# Patient Record
Sex: Female | Born: 1955 | Race: White | Hispanic: No | Marital: Single | Smoking: Current every day smoker
Health system: Southern US, Community
[De-identification: ages and names within clinical notes are randomized; demographics above are authoritative.]

## PROBLEM LIST (undated history)

## (undated) DIAGNOSIS — F419 Anxiety disorder, unspecified: Secondary | ICD-10-CM

## (undated) DIAGNOSIS — I10 Essential (primary) hypertension: Secondary | ICD-10-CM

## (undated) DIAGNOSIS — Z8639 Personal history of other endocrine, nutritional and metabolic disease: Secondary | ICD-10-CM

## (undated) DIAGNOSIS — K219 Gastro-esophageal reflux disease without esophagitis: Secondary | ICD-10-CM

## (undated) HISTORY — PX: OTHER SURGICAL HISTORY: SHX169

---

## 1999-02-12 ENCOUNTER — Ambulatory Visit (HOSPITAL_BASED_OUTPATIENT_CLINIC_OR_DEPARTMENT_OTHER): Admission: RE | Admit: 1999-02-12 | Discharge: 1999-02-12 | Payer: Self-pay | Admitting: Oral Surgery

## 2000-12-09 ENCOUNTER — Ambulatory Visit (HOSPITAL_COMMUNITY): Admission: RE | Admit: 2000-12-09 | Discharge: 2000-12-09 | Payer: Self-pay | Admitting: Obstetrics and Gynecology

## 2000-12-09 ENCOUNTER — Encounter (INDEPENDENT_AMBULATORY_CARE_PROVIDER_SITE_OTHER): Payer: Self-pay | Admitting: Specialist

## 2001-11-22 ENCOUNTER — Encounter: Payer: Self-pay | Admitting: Obstetrics and Gynecology

## 2001-11-29 ENCOUNTER — Inpatient Hospital Stay (HOSPITAL_COMMUNITY): Admission: RE | Admit: 2001-11-29 | Discharge: 2001-12-02 | Payer: Self-pay | Admitting: Obstetrics and Gynecology

## 2001-11-29 ENCOUNTER — Encounter (INDEPENDENT_AMBULATORY_CARE_PROVIDER_SITE_OTHER): Payer: Self-pay | Admitting: Specialist

## 2002-06-28 HISTORY — PX: ABDOMINAL HYSTERECTOMY: SHX81

## 2008-01-29 ENCOUNTER — Ambulatory Visit (HOSPITAL_COMMUNITY): Admission: RE | Admit: 2008-01-29 | Discharge: 2008-01-29 | Payer: Self-pay | Admitting: *Deleted

## 2008-01-30 ENCOUNTER — Ambulatory Visit (HOSPITAL_COMMUNITY): Admission: RE | Admit: 2008-01-30 | Discharge: 2008-01-30 | Payer: Self-pay | Admitting: *Deleted

## 2008-01-31 ENCOUNTER — Encounter: Admission: RE | Admit: 2008-01-31 | Discharge: 2008-03-19 | Payer: Self-pay | Admitting: *Deleted

## 2008-05-14 ENCOUNTER — Encounter: Admission: RE | Admit: 2008-05-14 | Discharge: 2008-05-14 | Payer: Self-pay | Admitting: *Deleted

## 2008-05-28 ENCOUNTER — Encounter (INDEPENDENT_AMBULATORY_CARE_PROVIDER_SITE_OTHER): Payer: Self-pay | Admitting: *Deleted

## 2008-05-28 ENCOUNTER — Observation Stay (HOSPITAL_COMMUNITY): Admission: RE | Admit: 2008-05-28 | Discharge: 2008-05-28 | Payer: Self-pay | Admitting: *Deleted

## 2008-12-02 ENCOUNTER — Ambulatory Visit (HOSPITAL_COMMUNITY): Admission: RE | Admit: 2008-12-02 | Discharge: 2008-12-03 | Payer: Self-pay | Admitting: *Deleted

## 2008-12-02 HISTORY — PX: LAPAROSCOPIC GASTRIC BANDING: SHX1100

## 2008-12-16 ENCOUNTER — Encounter: Admission: RE | Admit: 2008-12-16 | Discharge: 2009-03-16 | Payer: Self-pay | Admitting: Surgery

## 2010-04-29 ENCOUNTER — Encounter
Admission: RE | Admit: 2010-04-29 | Discharge: 2010-04-29 | Payer: Self-pay | Source: Home / Self Care | Attending: Surgery | Admitting: Surgery

## 2010-06-28 HISTORY — PX: OTHER SURGICAL HISTORY: SHX169

## 2010-07-15 ENCOUNTER — Encounter
Admission: RE | Admit: 2010-07-15 | Discharge: 2010-07-28 | Payer: Self-pay | Source: Home / Self Care | Attending: Surgery | Admitting: Surgery

## 2010-07-20 ENCOUNTER — Encounter: Payer: Self-pay | Admitting: *Deleted

## 2010-10-05 LAB — DIFFERENTIAL
Basophils Absolute: 0.1 10*3/uL (ref 0.0–0.1)
Basophils Relative: 0 % (ref 0–1)
Basophils Relative: 1 % (ref 0–1)
Eosinophils Absolute: 0.2 10*3/uL (ref 0.0–0.7)
Eosinophils Absolute: 0.3 10*3/uL (ref 0.0–0.7)
Eosinophils Relative: 2 % (ref 0–5)
Eosinophils Relative: 3 % (ref 0–5)
Lymphs Abs: 2.9 10*3/uL (ref 0.7–4.0)
Monocytes Absolute: 0.4 10*3/uL (ref 0.1–1.0)
Monocytes Absolute: 0.6 10*3/uL (ref 0.1–1.0)
Monocytes Relative: 5 % (ref 3–12)
Monocytes Relative: 5 % (ref 3–12)
Neutrophils Relative %: 61 % (ref 43–77)

## 2010-10-05 LAB — COMPREHENSIVE METABOLIC PANEL
ALT: 57 U/L — ABNORMAL HIGH (ref 0–35)
Albumin: 4 g/dL (ref 3.5–5.2)
Alkaline Phosphatase: 139 U/L — ABNORMAL HIGH (ref 39–117)
BUN: 15 mg/dL (ref 6–23)
Chloride: 104 mEq/L (ref 96–112)
Potassium: 4.6 mEq/L (ref 3.5–5.1)
Sodium: 141 mEq/L (ref 135–145)
Total Bilirubin: 0.7 mg/dL (ref 0.3–1.2)
Total Protein: 7.3 g/dL (ref 6.0–8.3)

## 2010-10-05 LAB — CBC
HCT: 39.7 % (ref 36.0–46.0)
HCT: 43.6 % (ref 36.0–46.0)
Hemoglobin: 13.7 g/dL (ref 12.0–15.0)
Hemoglobin: 14.4 g/dL (ref 12.0–15.0)
MCHC: 34.5 g/dL (ref 30.0–36.0)
MCV: 88.4 fL (ref 78.0–100.0)
Platelets: 193 10*3/uL (ref 150–400)
RBC: 4.5 MIL/uL (ref 3.87–5.11)
RDW: 15.8 % — ABNORMAL HIGH (ref 11.5–15.5)
WBC: 11.5 10*3/uL — ABNORMAL HIGH (ref 4.0–10.5)
WBC: 9 10*3/uL (ref 4.0–10.5)

## 2010-11-10 NOTE — Op Note (Signed)
Candice Holland, Candice Holland               ACCOUNT NO.:  [id number]   MEDICAL RECORD NO.:  [id number]          PATIENT TYPE:  INP   LOCATION:  0001                         FACILITY:  [facility]   PHYSICIAN:  Sandria Bales. Ezzard Standing, M.D.  DATE OF BIRTH:  1955/08/23   DATE OF PROCEDURE:  05/28/2008  DATE OF DISCHARGE:                               OPERATIVE REPORT   Date of surgery ??   PREOPERATIVE DIAGNOSIS:  Morbid obesity with a BMI of 48.9.   POSTOPERATIVE DIAGNOSES:  1. Morbid obesity with a BMI of 48.9.  2. Cirrhotic changes of the liver.   PROCEDURE:  Esophagogastroduodenoscopy.   SURGEON:  Dr. Ezzard Standing.   ASSESSMENT:  No first assistant.   ANESTHESIA:  General.   PROCEDURE:  Ms. Elderkin  is a 55 year old female who is undergoing an  attempted laparoscopic Roux-en-Y gastric bypass by Dr. Baruch Merl for  her morbid obesity.  Unfortunately, the patient was found to have  significant cirrhotic changes of the liver at laparoscopy.   There is a concern of the patient having potential portal hypertension  and, therefore, I am endoscoping the patient to evaluate for any  evidence of esophageal or gastric varices consistent with portal  hypertension, to evaluate the anatomy of  the duodenum, stomach or the  esophagus.   PROCEDURE NOTE:  The patient is in the supine position.  The patient  still has a laparoscope in the abdomen, passed the Olympus endoscope  down her throat into her stomach and duodenum without difficulty.  I  advanced to the second portion of duodenum and visualized second portion  of duodenum, first portion of duodenum, there is no ulcer or lesion.  The pylorus was widely patent.   The gastric mucosa is unremarkable with normal rugal folds.  No evidence  of inflammation, irritation.  I retroflexed the scope and looked at the  gastroesophageal junction from underneath and there was normal  gastroesophageal junction with no evidence of variceal changes.  The  scope was then  withdrawn into the GE junction which was a 36 cm from the  incisors (of lack of incisors) and there is no esophageal varices.  The  esophagus was otherwise normal looking.   The scope was then withdrawn.  I did no biopsies.  The patient tolerated  this portion of procedure well.   Dr. Colin Benton will dictate the laparoscopic exploration and liver biopsy.      Sandria Bales. Ezzard Standing, M.D.  Electronically Signed     DHN/MEDQ  D:  05/28/2008  T:  05/28/2008  Job:  [id number]   cc:   Alfonse Ras, MD  [address]  [place]  [place] [address]   Sandria Bales. Ezzard Standing, M.D.  [address], [address]  [place]  [place] [address]

## 2010-11-10 NOTE — Op Note (Signed)
NAME:  Candice Holland, Candice Holland               ACCOUNT NO.:  [id number]   MEDICAL RECORD NO.:  [id number]          PATIENT TYPE:  INP   LOCATION:  0001                         FACILITY:  [facility]   PHYSICIAN:  Alfonse Ras, MD   DATE OF BIRTH:  Nov 15, 1955   DATE OF PROCEDURE:  DATE OF DISCHARGE:                               OPERATIVE REPORT   PREOPERATIVE DIAGNOSES:  Medically refractory morbid obesity, elevated  liver function tests, BMI of 48.49, and hypertension.   POSTOPERATIVE DIAGNOSIS:  Medically refractory morbid obesity, elevated  liver function tests, BMI of 48.49, hypertension., massive hepatomegaly,  and cirrhosis.   PROCEDURE:  Diagnostic laparoscopy, liver biopsy, lysis of adhesions,  and upper endoscopy by Dr. Sandria Bales. Newman.   SURGEON:  Alfonse Ras, MD   ASSISTANT:  Sandria Bales. Ezzard Standing, M.D.   ANESTHESIA:  General.   DESCRIPTION:  The patient was taken to the operating room, after  informed consent was granted for laparoscopic roux-en-y gastric bypass.  Informed consent was placed on the chart.   Further, the patient underwent general anesthesia, and the abdomen was  prepped and draped in normal sterile fashion using a 12-mm trocar in the  left upper quadrant in the OptiView technique I entered the abdomen.  This was done on direct visualization.  Pneumoperitoneum was obtained.  On entering the abdomen, it was obvious that the liver was massive and  extended down almost to the right iliac crest.  It was very nodular and  consistent with cirrhosis.  Adhesions from the previous hysterectomy  were taken down using the harmonic scalpel.  However, the patient had a  lot of bleeding just from these filmy adhesions.  Because of the massive  size of the liver, what looked like clinically to be a coagulopathy, I  opted to perform a liver biopsy and abort the attempts with Roux-en-Y  gastric bypass.  Tru-Cut liver biopsy was obtained.  Pressure was held  for 10 minutes until  adequate hemostasis was ensured.  Biopsy was sent  for pathologic evaluation.  The abdomen was irrigated.  The trocars were  removed.  Pneumoperitoneum was released.  Dr. Ezzard Standing performed upper  endoscopy which will be dictated in a separate note.  Incisions were  closed with subcuticular 4-0 Monocryl.  Steri-Strips and sterile  dressings were applied.  The patient tolerated the procedure well, and  went to PACU in stable condition.   The patient will be kept for observation this afternoon.  I will check  an H&H as well as the hepatitis profile and coags.  I discussed the case  with the patient's sister and parents, and explained that I think she  would be a good candidate for laparoscopic adjustable gastric banding,  but at this time aborted laparoscopic gastric bypass in the  lieu of  further workup of her liver disease.      Alfonse Ras, MD  Electronically Signed     KRE/MEDQ  D:  05/28/2008  T:  05/28/2008  Job:  [id number]

## 2010-11-10 NOTE — Op Note (Signed)
NAMEESBEIDY, MCLAINE               ACCOUNT NO.:  [id number]   MEDICAL RECORD NO.:  [id number]          PATIENT TYPE:  AMB   LOCATION:  DAY                          FACILITY:  [facility]   PHYSICIAN:  Thornton Park. Daphine Deutscher, MD  DATE OF BIRTH:  1956-01-28   DATE OF PROCEDURE:  DATE OF DISCHARGE:                               OPERATIVE REPORT   PREOPERATIVE INDICATIONS:  Diabetes, NASH with active cirrhosis noted on  attempted Roux-en-Y gastric bypass December 2009.   PROCEDURE:  Laparoscopic adjustable gastric banding (Allergan APL  system).   SURGEON:  Luretha Murphy.   ASSISTANT:  Ovidio Kin.   ANESTHESIA:  General endotracheal.   DESCRIPTION OF PROCEDURE:  Candice Holland is a 55 year old white female  taken to room 1 on Monday, December 02, 2008 and given general anesthesia.  The abdomen was prepped with __________ equivalent and draped sterilely.  I used her previous incision in the left upper quadrant and entered the  abdomen with an OptiVu technique without difficulty.  The abdomen was  insufflated.  Standard port placement was used.  She did have some  adhesions in the lower abdomen which I did not disturb.  The liver was  then retractor was placed straight away to make sure we would be able to  see this before the trocars were placed.  Once these were in place I  dissected the left side first and then the right side.  A sizing balloon  was then placed, blown up with 15 mL of air, pulled back and did not  indicate any evidence of a hiatal hernia.  We then went and placed the  APL band securing it over the sizing tubing and then withdrew the  tubing.   The anterior plication was then carried out with three interrupted  sutures of Surgidac and held in place with tie knots.  The band port was  then placed in the subcutaneous pocket on the right side and after a  piece of mesh was sewn to the back of the port.  The wounds were  irrigated and closed with 4-0 Vicryl, Benzoin, and  Steri-Strips.  The  patient seemed to tolerate the procedure well and was taken to recovery  room in satisfactory condition.      Thornton Park Daphine Deutscher, MD  Electronically Signed     MBM/MEDQ  D:  12/02/2008  T:  12/02/2008  Job:  [id number]   cc:   Rema Fendt, MD   Herma Mering (?), MD

## 2010-11-13 NOTE — Discharge Summary (Signed)
[facility]  Patient:    Candice Holland, Candice Holland Visit Number: [id number] MRN: [id number]          Service Type: GYN Location: 4W 0446 01 Attending Physician:  Lendon Colonel Dictated by:   Kathie Rhodes. Kyra Manges, M.D. Admit Date:  11/29/2001 Discharge Date: 12/02/2001   CC:         Verl Dicker, M.D.   Discharge Summary  ADMITTING DIAGNOSIS:  Persistent menorrhagia.  POSTOPERATIVE DIAGNOSIS:  Adenomyosis, uterine leiomyoma.  HISTORY OF PRESENT ILLNESS:  The patient is a 55 year old female status post ectopic pregnancy who was admitted for hysterectomy for persistent menorrhagia.  LABORATORY STUDIES:  Admission hemoglobin was 13, white count was 13,000. Chemistry was essentially normal other than a glucose of 145.  Cardiogram and chest x-rays were normal.  HOSPITAL COURSE:  Patient underwent a hysterectomy and bilateral salpingo-oophorectomy with the findings of dense pelvic adhesions.  At the time of surgery, a cystotomy was performed.  It was recognized ______ and repaired.  However, it was still not watertight, so intraoperative urologic consultation with Dr. Wanda Plump was obtained and a complex repair of the bladder was performed by Dr. Wanda Plump.  Her postoperative course was uncomplicated.  She remained afebrile and without complaints.  Her suprapubic drain was removed on day #2.  She is discharged with an indwelling Foley to return to the office in one week.  She will see Dr. Wanda Plump in approximately two weeks.  CONDITION ON DISCHARGE:  Improved. Dictated by:   S. Kyra Manges, M.D. Attending Physician:  Lendon Colonel DD:  12/09/01 TD:  12/11/01 Job: [id number]

## 2010-11-13 NOTE — H&P (Signed)
[facility]  Patient:    Candice Holland, MORNING Visit Number: [id number] MRN: [id number]          Service Type: GYN Location: 4W 0446 01 Attending Physician:  Lendon Colonel Dictated by:   Kathie Rhodes. Kyra Manges, M.D. Admit Date:  11/29/2001                           History and Physical  CHIEF COMPLAINT:  Continued heavy bleeding.  HISTORY OF PRESENT ILLNESS:  The patient is a 55 year old gravida 2, para 0 female who presents for surgery for continued heavy bleeding despite conservative efforts including hysteroscopy.  She is currently on two Lo/Ovral daily to maintain her heavy periods.  Her comorbidities include hypertension controlled with Zestril and hydrochlorothiazide and obesity.  She has a history of exploratory laparotomy for ectopic.  She has a history of D&C for therapeutic terminations.  She had carpal tunnel surgery twice, and hysteroscopy.  Her last period was Oct 28, 2001.  Her hemoglobin has remained stable.  Because of the continued bleeding, a hysterectomy was recommended. 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  Pathologically, these were all benign.  REVIEW OF SYSTEMS:  HEENT:  She wears contacts but has noted no decrease in visual or auditory acuity.  No dizziness.  HEART:  She has been treated for hypertension, fairly stable at this point with an ACE inhibitor and diuretic. She denies chest pain, shortness of breath, history of mitral valve prolapse, or heart murmur.  LUNGS:  No chronic cough.  GENITOURINARY:  No stress incontinence.  Frequency.  No history of blood in her urine. GASTROINTESTINAL:  No bowel habit change.  She admits to irritable bowel syndrome.  She denies melena, weight loss, or gain.  MUSCLES, BONES, JOINTS: Other than bilateral carpal tunnel, unremarkable.  SOCIAL HISTORY:  She works for [employer].  Smokes a pack of cigarettes a day, and she confesses she is  going to try to stop in the postoperative period.  FAMILY HISTORY:  Her mother and father are both living and well.  She has one brother who is living and well.  No cancer in the family.  She is ______ hypertension.  PHYSICAL EXAMINATION:  GENERAL:  Well-developed, nourished female who appears to be her stated age.  VITAL SIGNS:  Weight 242 pounds, height 5 feet 3 inches.  Blood pressure 140/80.  HEENT:  Pupils are round and regular and reactive to light and accommodation. Oropharynx is not injected.  NECK:  Supple.  Carotid pulses are felt bilaterally without bruits.  Thyroid is not enlarged.  LUNGS:  Clear to P&A.  BREASTS:  No masses or tenderness.  HEART:  Normal sinus rhythm.  No heaves, thrills, rubs, or gallops.  ABDOMEN:  Soft, somewhat obese.  Most of her obesity is truncal in nature. Liver, spleen, and kidneys are not palpated.  EXTREMITIES:  Good range of motion.  Equal pulses and reflexes.  PELVIC:  Anterior uterus, appears to be normal in size and shape, well supported, without masses.  RECTOVAGINAL:  Confirms.  IMPRESSION:  Continued heavy periods despite conservative efforts to control these periods.  PLAN:  Total abdominal hysterectomy and bilateral salpingo-oophorectomy.  The risks and benefits have been discussed with the patient including bowel damage, bladder infection, and blood transfusion, and the risks ______ to these. Dictated by:   S. Kyra Manges, M.D. Attending Physician:  Lendon Colonel DD:  11/29/01 TD:  11/30/01 Job: [id number]

## 2011-02-12 ENCOUNTER — Ambulatory Visit (INDEPENDENT_AMBULATORY_CARE_PROVIDER_SITE_OTHER): Payer: 59 | Admitting: Physician Assistant

## 2011-02-12 ENCOUNTER — Encounter (INDEPENDENT_AMBULATORY_CARE_PROVIDER_SITE_OTHER): Payer: Self-pay

## 2011-02-12 VITALS — BP 142/82 | Ht 61.0 in | Wt 174.0 lb

## 2011-02-12 DIAGNOSIS — Z4651 Encounter for fitting and adjustment of gastric lap band: Secondary | ICD-10-CM

## 2011-02-12 NOTE — Patient Instructions (Signed)
Take clear liquids for the next 48 hours. Thin protein shakes are ok to start on Saturday evening. Call us if you have persistent vomiting or regurgitation, night cough or reflux symptoms. Return as scheduled or sooner if you notice no changes in hunger/portion sizes.   

## 2011-02-12 NOTE — Progress Notes (Signed)
  HISTORY: Candice Holland is a 55 y.o.female who received an AP-Large lap-band in June 2010 by Dr. Daphine Deutscher. She's doing very well, having lost 15 lbs since her last visit in April. Her increased protein intake has helped her with this weight loss. She has noticed an increase in her hunger and portion sizes over the past few weeks, however, and believes a fill is needed. She denies persistent regurgitation or vomiting symptoms.  VITAL SIGNS: Filed Vitals:   02/12/11 1009  BP: 142/82    PHYSICAL EXAM: Physical exam reveals a very well-appearing 55 y.o.female in no apparent distress Neurologic: Awake, alert, oriented Psych: Bright affect, conversant Respiratory: Breathing even and unlabored. No stridor or wheezing Abdomen: Soft, nontender, nondistended to palpation. Incisions well-healed. No incisional hernias. Port easily palpated. Extremities: Atraumatic, good range of motion.  ASSESMENT: 55 y.o.  female  s/p AP-Large lap-band.   PLAN: The patient's port was accessed with a 20G Huber needle without difficulty. Clear fluid was aspirated and 0.25 mL saline was added to the port. The patient was able to swallow water without difficulty following the procedure and was instructed to take clear liquids for the next 24-48 hours and advance slowly as tolerated.

## 2011-03-30 LAB — COMPREHENSIVE METABOLIC PANEL
AST: 92 U/L — ABNORMAL HIGH (ref 0–37)
CO2: 28 mEq/L (ref 19–32)
Calcium: 9.9 mg/dL (ref 8.4–10.5)
Creatinine, Ser: 1.03 mg/dL (ref 0.4–1.2)
GFR calc Af Amer: >60 mL/min (ref >60)
GFR calc non Af Amer: 56 mL/min — ABNORMAL LOW (ref >60)
Glucose, Bld: 104 mg/dL — ABNORMAL HIGH (ref 70–99)
Total Protein: 7.1 g/dL (ref 6.0–8.3)

## 2011-03-30 LAB — DIFFERENTIAL
Basophils Absolute: 0.6 10*3/uL — ABNORMAL HIGH (ref 0.0–0.1)
Eosinophils Relative: 3 % (ref 0–5)
Lymphocytes Relative: 35 % (ref 12–46)
Monocytes Relative: 7 % (ref 3–12)
Smear Review: ADEQUATE

## 2011-03-30 LAB — CBC
MCHC: 32.5 g/dL (ref 30.0–36.0)
MCV: 87.7 fL (ref 78.0–100.0)
RBC: 4.77 MIL/uL (ref 3.87–5.11)
RDW: 14 % (ref 11.5–15.5)

## 2011-04-01 LAB — PROTIME-INR
INR: 1.2 (ref 0.00–1.49)
Prothrombin Time: 15.9 seconds — ABNORMAL HIGH (ref 11.6–15.2)

## 2011-04-01 LAB — GLUCOSE, CAPILLARY: Glucose-Capillary: 113 mg/dL — ABNORMAL HIGH (ref 70–99)

## 2011-04-01 LAB — CBC
RBC: 4.54 MIL/uL (ref 3.87–5.11)
WBC: 7.2 10*3/uL (ref 4.0–10.5)

## 2011-04-01 LAB — APTT: aPTT: 31 seconds (ref 24–37)

## 2011-09-02 ENCOUNTER — Encounter (INDEPENDENT_AMBULATORY_CARE_PROVIDER_SITE_OTHER): Payer: 59

## 2011-10-07 ENCOUNTER — Ambulatory Visit (INDEPENDENT_AMBULATORY_CARE_PROVIDER_SITE_OTHER): Payer: 59 | Admitting: Physician Assistant

## 2011-10-07 ENCOUNTER — Encounter (INDEPENDENT_AMBULATORY_CARE_PROVIDER_SITE_OTHER): Payer: Self-pay

## 2011-10-07 VITALS — BP 128/78 | HR 70 | Temp 97.4°F | Resp 18 | Ht 61.0 in | Wt 154.4 lb

## 2011-10-07 DIAGNOSIS — Z4651 Encounter for fitting and adjustment of gastric lap band: Secondary | ICD-10-CM

## 2011-10-07 NOTE — Patient Instructions (Signed)
Take clear liquids tonight. Thin protein shakes are ok to start tomorrow morning. Slowly advance your diet thereafter. Call us if you have persistent vomiting or regurgitation, night cough or reflux symptoms. Return as scheduled or sooner if you notice no changes in hunger/portion sizes.  

## 2011-10-07 NOTE — Progress Notes (Signed)
  HISTORY: Candice Holland is a 56 y.o.female who received an AP-Large lap-band in June 2010 by Dr. Daphine Deutscher. She was last seen in August 2012 and has lost almost 20 lbs. She is now under BMI 30. She denies persistent regurgitation or vomiting. She'd like to lose 10 more pounds. She's eating a bit more than she has in the past.  VITAL SIGNS: Filed Vitals:   10/07/11 1213  BP: 128/78  Pulse: 70  Temp: 97.4 F (36.3 C)  Resp: 18    PHYSICAL EXAM: Physical exam reveals a very well-appearing 56 y.o.female in no apparent distress Neurologic: Awake, alert, oriented Psych: Bright affect, conversant Respiratory: Breathing even and unlabored. No stridor or wheezing Abdomen: Soft, nontender, nondistended to palpation. Incisions well-healed. No incisional hernias. Port easily palpated. Extremities: Atraumatic, good range of motion.  ASSESMENT: 56 y.o.  female  s/p AP-large lap-band.   PLAN: The patient's port was accessed with a 20G Huber needle without difficulty. Clear fluid was aspirated and 0.25 mL saline was added to the port. The patient was able to swallow water without difficulty following the procedure and was instructed to take clear liquids for the next 24-48 hours and advance slowly as tolerated.

## 2012-07-27 ENCOUNTER — Ambulatory Visit (INDEPENDENT_AMBULATORY_CARE_PROVIDER_SITE_OTHER): Payer: BC Managed Care – PPO | Admitting: Physician Assistant

## 2012-07-27 ENCOUNTER — Encounter (INDEPENDENT_AMBULATORY_CARE_PROVIDER_SITE_OTHER): Payer: Self-pay

## 2012-07-27 VITALS — BP 140/84 | HR 71 | Temp 97.7°F | Ht 61.0 in | Wt 151.6 lb

## 2012-07-27 DIAGNOSIS — Z4651 Encounter for fitting and adjustment of gastric lap band: Secondary | ICD-10-CM

## 2012-07-27 NOTE — Patient Instructions (Signed)
Take clear liquids tonight. Thin protein shakes are ok to start tomorrow morning. Slowly advance your diet thereafter. Call us if you have persistent vomiting or regurgitation, night cough or reflux symptoms. Return as scheduled or sooner if you notice no changes in hunger/portion sizes.  

## 2012-07-27 NOTE — Progress Notes (Signed)
  HISTORY: Candice Holland is a 57 y.o.female who received an AP-Large lap-band in June 2010 by Dr. Daphine Deutscher. She comes in with a few months of occasional solid food dysphagia which has progressed over the past month. She is unable to take adequate solids and is reaching for chips and slider foods.  VITAL SIGNS: Filed Vitals:   07/27/12 1218  BP: 140/84  Pulse: 71  Temp: 97.7 F (36.5 C)    PHYSICAL EXAM: Physical exam reveals a very well-appearing 57 y.o.female in no apparent distress Neurologic: Awake, alert, oriented Psych: Bright affect, conversant Respiratory: Breathing even and unlabored. No stridor or wheezing Abdomen: Soft, nontender, nondistended to palpation. Incisions well-healed. No incisional hernias. Port easily palpated. Extremities: Atraumatic, good range of motion.  ASSESMENT: 57 y.o.  female  s/p AP-Large lap-band.   PLAN: The patient's port was accessed with a 20G Huber needle without difficulty. Clear fluid was aspirated and 1 mL saline was removed from the port. The patient was able to swallow water without difficulty following the procedure and was instructed to take clear liquids for the next 24-48 hours and advance slowly as tolerated. I asked her to return in three weeks for re-evaluation.

## 2012-08-04 ENCOUNTER — Telehealth (INDEPENDENT_AMBULATORY_CARE_PROVIDER_SITE_OTHER): Payer: Self-pay | Admitting: General Surgery

## 2012-08-04 ENCOUNTER — Other Ambulatory Visit (INDEPENDENT_AMBULATORY_CARE_PROVIDER_SITE_OTHER): Payer: Self-pay

## 2012-08-04 ENCOUNTER — Telehealth (INDEPENDENT_AMBULATORY_CARE_PROVIDER_SITE_OTHER): Payer: Self-pay

## 2012-08-04 DIAGNOSIS — R110 Nausea: Secondary | ICD-10-CM

## 2012-08-04 NOTE — Telephone Encounter (Signed)
Pt called to report that she had dry heaves this am with pink tinged mucus after eating breakfast, also experiencing some nausea. I reviewed this with Dr. Daphine Deutscher and he requested an Upper GI Series to be done today preferably or Monday am. Pt also to stay on clear liquids. /gy/ I contacted pt with instructions/gy/ [phone]

## 2012-08-04 NOTE — Telephone Encounter (Signed)
I called pt and advised her of Dr. Ermalene Searing instructions reading ugi series and clear liquid diet./gy

## 2012-08-04 NOTE — Telephone Encounter (Signed)
Returned pt's call with her appt date and time for her UGI that MM had requested I order for her.    Monday, Feb 10th @ 830a at [facility] [address]

## 2012-08-07 ENCOUNTER — Ambulatory Visit
Admission: RE | Admit: 2012-08-07 | Discharge: 2012-08-07 | Disposition: A | Discharge status: Home / Self Care | Payer: BC Managed Care – PPO | Source: Ambulatory Visit | Attending: Surgery | Admitting: Surgery

## 2012-08-07 DIAGNOSIS — R110 Nausea: Secondary | ICD-10-CM

## 2012-08-24 ENCOUNTER — Encounter (INDEPENDENT_AMBULATORY_CARE_PROVIDER_SITE_OTHER): Payer: Self-pay

## 2012-08-24 ENCOUNTER — Ambulatory Visit (INDEPENDENT_AMBULATORY_CARE_PROVIDER_SITE_OTHER): Payer: BC Managed Care – PPO | Admitting: Physician Assistant

## 2012-08-24 VITALS — BP 122/88 | HR 72 | Temp 98.0°F | Resp 18 | Ht 61.0 in | Wt 153.0 lb

## 2012-08-24 DIAGNOSIS — Z4651 Encounter for fitting and adjustment of gastric lap band: Secondary | ICD-10-CM

## 2012-08-24 NOTE — Patient Instructions (Signed)
Take clear liquids tonight. Thin protein shakes are ok to start tomorrow morning. Slowly advance your diet thereafter. Call us if you have persistent vomiting or regurgitation, night cough or reflux symptoms. Return as scheduled or sooner if you notice no changes in hunger/portion sizes.  

## 2012-08-24 NOTE — Progress Notes (Signed)
  HISTORY: Candice Holland is a 57 y.o.female who received an AP-Large lap-band in June 2010 by Dr. Daphine Deutscher. She comes in with increasing hunger and portion sizes since fluid removal one month ago for obstructive symptoms. She has no further obstruction and would like to get back on track with weight loss.  VITAL SIGNS: Filed Vitals:   08/24/12 1136  BP: 122/88  Pulse: 72  Temp: 98 F (36.7 C)  Resp: 18    PHYSICAL EXAM: Physical exam reveals a very well-appearing 57 y.o.female in no apparent distress Neurologic: Awake, alert, oriented Psych: Bright affect, conversant Respiratory: Breathing even and unlabored. No stridor or wheezing Abdomen: Soft, nontender, nondistended to palpation. Incisions well-healed. No incisional hernias. Port easily palpated. Extremities: Atraumatic, good range of motion.  ASSESMENT: 57 y.o.  female  s/p AP-Large lap-band.   PLAN: The patient's port was accessed with a 20G Huber needle without difficulty. Clear fluid was aspirated and 0.5 mL saline was added to the port. The patient was able to swallow water without difficulty following the procedure and was instructed to take clear liquids for the next 24-48 hours and advance slowly as tolerated.

## 2012-11-23 ENCOUNTER — Ambulatory Visit (INDEPENDENT_AMBULATORY_CARE_PROVIDER_SITE_OTHER): Payer: BC Managed Care – PPO | Admitting: Physician Assistant

## 2012-11-23 ENCOUNTER — Encounter (INDEPENDENT_AMBULATORY_CARE_PROVIDER_SITE_OTHER): Payer: BC Managed Care – PPO

## 2012-11-23 ENCOUNTER — Encounter (INDEPENDENT_AMBULATORY_CARE_PROVIDER_SITE_OTHER): Payer: Self-pay

## 2012-11-23 VITALS — BP 128/82 | HR 64 | Temp 98.2°F | Resp 18 | Ht 61.0 in | Wt 156.6 lb

## 2012-11-23 DIAGNOSIS — Z4651 Encounter for fitting and adjustment of gastric lap band: Secondary | ICD-10-CM

## 2012-11-23 NOTE — Patient Instructions (Signed)
Take clear liquids tonight. Thin protein shakes are ok to start tomorrow morning. Slowly advance your diet thereafter. Call us if you have persistent vomiting or regurgitation, night cough or reflux symptoms. Return as scheduled or sooner if you notice no changes in hunger/portion sizes.  

## 2012-11-23 NOTE — Progress Notes (Signed)
  HISTORY: Candice Holland is a 57 y.o.female who received an AP-Standard lap-band in June 2010 by Dr. Daphine Deutscher. She comes in with three lbs weight gain since her last adjustment in February. We removed 1 mL fluid in January for obstructive symptoms. Half of that was replaced. She reports increased hunger and larger than desired portion sizes. She reports that her obstruction was likely triggered by stress late in the year as previously she was doing fine. She has no complaints of reflux or regurgitation presently.  VITAL SIGNS: Filed Vitals:   11/23/12 1238  BP: 128/82  Pulse: 64  Temp: 98.2 F (36.8 C)  Resp: 18    PHYSICAL EXAM: Physical exam reveals a very well-appearing 57 y.o.female in no apparent distress Neurologic: Awake, alert, oriented Psych: Bright affect, conversant Respiratory: Breathing even and unlabored. No stridor or wheezing Abdomen: Soft, nontender, nondistended to palpation. Incisions well-healed. No incisional hernias. Port easily palpated. Extremities: Atraumatic, good range of motion.  ASSESMENT: 57 y.o.  female  s/p AP-Standard lap-band.   PLAN: The patient's port was accessed with a 20G Huber needle without difficulty. Clear fluid was aspirated and 0.5 mL saline was added to the port. The patient was able to swallow water without difficulty following the procedure and was instructed to take clear liquids for the next 24-48 hours and advance slowly as tolerated. We will have her return in three months or sooner if needed.

## 2013-02-22 ENCOUNTER — Encounter (INDEPENDENT_AMBULATORY_CARE_PROVIDER_SITE_OTHER): Payer: BC Managed Care – PPO

## 2013-03-15 ENCOUNTER — Encounter (INDEPENDENT_AMBULATORY_CARE_PROVIDER_SITE_OTHER): Payer: BC Managed Care – PPO

## 2013-04-12 ENCOUNTER — Encounter (INDEPENDENT_AMBULATORY_CARE_PROVIDER_SITE_OTHER): Payer: BC Managed Care – PPO

## 2013-05-03 ENCOUNTER — Encounter (INDEPENDENT_AMBULATORY_CARE_PROVIDER_SITE_OTHER): Payer: BC Managed Care – PPO

## 2013-06-29 ENCOUNTER — Encounter (INDEPENDENT_AMBULATORY_CARE_PROVIDER_SITE_OTHER): Payer: BC Managed Care – PPO

## 2013-07-05 ENCOUNTER — Encounter (INDEPENDENT_AMBULATORY_CARE_PROVIDER_SITE_OTHER): Payer: Self-pay

## 2013-07-05 ENCOUNTER — Ambulatory Visit (INDEPENDENT_AMBULATORY_CARE_PROVIDER_SITE_OTHER): Payer: BC Managed Care – PPO | Admitting: Physician Assistant

## 2013-07-05 VITALS — BP 140/86 | HR 72 | Temp 98.5°F | Resp 14 | Ht 61.0 in | Wt 163.0 lb

## 2013-07-05 DIAGNOSIS — Z4651 Encounter for fitting and adjustment of gastric lap band: Secondary | ICD-10-CM

## 2013-07-05 NOTE — Patient Instructions (Signed)
Return in one month. Focus on good food choices as well as physical activity. Return sooner if you have an increase in hunger, portion sizes or weight. Return also for difficulty swallowing, night cough, reflux.   

## 2013-07-05 NOTE — Progress Notes (Signed)
  HISTORY: Candice Holland is a 58 y.o.female who received an AP-Large lap-band in June 2010 by Dr. Daphine DeutscherMartin. She comes in with complaints of solid food dysphagia since before thanksgiving. She said this has occurred gradually. She is able to get liquds down without a problem.Marland Kitchen.  VITAL SIGNS: Filed Vitals:   07/05/13 1636  BP: 140/86  Pulse: 72  Temp: 98.5 F (36.9 C)  Resp: 14    PHYSICAL EXAM: Physical exam reveals a very well-appearing 58 y.o.female in no apparent distress Neurologic: Awake, alert, oriented Psych: Bright affect, conversant Respiratory: Breathing even and unlabored. No stridor or wheezing Abdomen: Soft, nontender, nondistended to palpation. Incisions well-healed. No incisional hernias. Port easily palpated. Extremities: Atraumatic, good range of motion.  ASSESMENT: 58 y.o.  female  s/p AP-Large lap-band.   PLAN: The patient's port was accessed with a 20G Huber needle without difficulty. Clear fluid was aspirated and 0.5 mL saline was removed. The patient was advised to concentrate on healthy food choices and to avoid slider foods high in fats and carbohydrates. I'll have her back in one month or sooner if needed.

## 2013-08-02 ENCOUNTER — Encounter (INDEPENDENT_AMBULATORY_CARE_PROVIDER_SITE_OTHER): Payer: Self-pay

## 2013-08-02 ENCOUNTER — Ambulatory Visit (INDEPENDENT_AMBULATORY_CARE_PROVIDER_SITE_OTHER): Payer: BC Managed Care – PPO | Admitting: Physician Assistant

## 2013-08-02 VITALS — BP 160/92 | HR 78 | Temp 97.9°F | Resp 14 | Ht 61.0 in | Wt 167.2 lb

## 2013-08-02 DIAGNOSIS — Z4651 Encounter for fitting and adjustment of gastric lap band: Secondary | ICD-10-CM

## 2013-08-02 NOTE — Patient Instructions (Signed)

## 2013-08-02 NOTE — Progress Notes (Signed)
  HISTORY: Candice Holland is a 58 y.o.female who received an AP-Large lap-band in June 2010 by Dr. Daphine DeutscherMartin. She comes in one month after having 0.5 mL removed for solid food dysphagia, with 4 lbs weight gain and complaints of being able to eat too much at one sitting. She denies further obstructive symptoms.  VITAL SIGNS: Filed Vitals:   08/02/13 1347  BP: 160/92  Pulse: 78  Temp: 97.9 F (36.6 C)  Resp: 14    PHYSICAL EXAM: Physical exam reveals a very well-appearing 58 y.o.female in no apparent distress Neurologic: Awake, alert, oriented Psych: Bright affect, conversant Respiratory: Breathing even and unlabored. No stridor or wheezing Abdomen: Soft, nontender, nondistended to palpation. Incisions well-healed. No incisional hernias. Port easily palpated. Extremities: Atraumatic, good range of motion.  ASSESMENT: 58 y.o.  female  s/p AP-Large lap-band.   PLAN: The patient's port was accessed with a 20G Huber needle without difficulty. Clear fluid was aspirated and 0.35 mL saline was added to the port. The patient was able to swallow water without difficulty following the procedure and was instructed to take clear liquids for the next 24-48 hours and advance slowly as tolerated.

## 2013-10-18 ENCOUNTER — Encounter (INDEPENDENT_AMBULATORY_CARE_PROVIDER_SITE_OTHER): Payer: Self-pay

## 2013-10-18 ENCOUNTER — Ambulatory Visit (INDEPENDENT_AMBULATORY_CARE_PROVIDER_SITE_OTHER): Payer: BC Managed Care – PPO | Admitting: Physician Assistant

## 2013-10-18 VITALS — BP 118/80 | HR 72 | Temp 97.3°F | Ht 61.0 in | Wt 168.4 lb

## 2013-10-18 DIAGNOSIS — Z4651 Encounter for fitting and adjustment of gastric lap band: Secondary | ICD-10-CM

## 2013-10-18 NOTE — Progress Notes (Signed)
  HISTORY: Candice Holland is a 58 y.o.female who received an AP-Large lap-band in June 2010 by Dr. Daphine DeutscherMartin. She comes in with one pound of weight gain since her last visit. She describes going on a cruise and being able to eat a large piece of prime rib. She is surprised by the volume of food she's able to eat and she's concerned that something has changed with her band. She denies regurgitation or reflux. She admits to not eating particularly well, but she is recording her intake since January.  VITAL SIGNS: Filed Vitals:   10/18/13 1421  BP: 118/80  Pulse: 72  Temp: 97.3 F (36.3 C)    PHYSICAL EXAM: Physical exam reveals a very well-appearing 58 y.o.female in no apparent distress Neurologic: Awake, alert, oriented Psych: Bright affect, conversant Respiratory: Breathing even and unlabored. No stridor or wheezing Abdomen: Soft, nontender, nondistended to palpation. Incisions well-healed. No incisional hernias. Port easily palpated. Extremities: Atraumatic, good range of motion.  ASSESMENT: 58 y.o.  female  s/p AP-Large lap-band.   PLAN: The patient's port was accessed with a 20G Huber needle without difficulty. Clear fluid was aspirated and 0.25 mL saline was added to the port to give a total predicted volume of 11.25 mL. This is after confirmation of 11 mL in the band. The patient was able to swallow water without difficulty following the procedure and was instructed to take clear liquids for the next 24-48 hours and advance slowly as tolerated.

## 2013-10-18 NOTE — Patient Instructions (Signed)

## 2013-12-18 ENCOUNTER — Other Ambulatory Visit (INDEPENDENT_AMBULATORY_CARE_PROVIDER_SITE_OTHER): Payer: Self-pay

## 2013-12-18 ENCOUNTER — Telehealth (INDEPENDENT_AMBULATORY_CARE_PROVIDER_SITE_OTHER): Payer: Self-pay

## 2013-12-18 NOTE — Telephone Encounter (Signed)
Called and left message for patient to call our office RE: Referral to Nutrition.  Patient needs to let our office know if which Nutritionist she prefers to see.  Will await a return call prior to completing her Referral

## 2014-01-17 ENCOUNTER — Encounter (INDEPENDENT_AMBULATORY_CARE_PROVIDER_SITE_OTHER): Payer: BC Managed Care – PPO

## 2014-02-14 ENCOUNTER — Encounter (INDEPENDENT_AMBULATORY_CARE_PROVIDER_SITE_OTHER): Payer: BC Managed Care – PPO

## 2014-02-28 ENCOUNTER — Encounter (INDEPENDENT_AMBULATORY_CARE_PROVIDER_SITE_OTHER): Payer: BC Managed Care – PPO

## 2015-03-07 NOTE — Progress Notes (Signed)
Please put orders in Epic surgery 03-26-15 pre op 03-20-15 Thanks 

## 2015-03-11 ENCOUNTER — Ambulatory Visit: Payer: Self-pay | Admitting: Orthopedic Surgery

## 2015-03-11 NOTE — Progress Notes (Signed)
Preoperative surgical orders have been place into the Epic hospital system for Candice Holland on 03/11/2015, 12:03 PM  by Patrica Duel for surgery on 03-26-2015.  Preop Knee Scope orders including IV Tylenol and IV Decadron as long as there are no contraindications to the above medications. Avel Peace, PA-C

## 2015-03-18 ENCOUNTER — Other Ambulatory Visit (HOSPITAL_COMMUNITY): Payer: Self-pay | Admitting: *Deleted

## 2015-03-18 NOTE — Patient Instructions (Addendum)
FANNYE MYER  03/18/2015   Your procedure is scheduled on: Wednesday September 28th, 2016  Report to [facility] Main  Entrance take [place]  elevators to 3rd floor to  Short Stay Center at 1030 AM.  Call this number if you have problems the morning of surgery [phone]   Remember: ONLY 1 PERSON MAY GO WITH YOU TO SHORT STAY TO GET  READY MORNING OF YOUR SURGERY.  Do not eat food or drink liquids :After Midnight.     Take these medicines the morning of surgery with A SIP OF WATER: none             You may not have any metal on your body including hair pins and              piercings  Do not wear jewelry, make-up, lotions, powders or perfumes, deodorant             Do not wear nail polish.  Do not shave  48 hours prior to surgery.              Men may shave face and neck.   Do not bring valuables to the hospital. [place] IS NOT             RESPONSIBLE   FOR VALUABLES.  Contacts, dentures or bridgework may not be worn into surgery.  Leave suitcase in the car. After surgery it may be brought to your room.     Patients discharged the day of surgery will not be allowed to drive home.  Name and phone number of your driver: gene wyatt boyfriend cell [phone]  Special Instructions: N/A              Please read over the following fact sheets you were given: _____________________________________________________________________             [facility] - Preparing for Surgery Before surgery, you can play an important role.  Because skin is not sterile, your skin needs to be as free of germs as possible.  You can reduce the number of germs on your skin by washing with CHG (chlorahexidine gluconate) soap before surgery.  CHG is an antiseptic cleaner which kills germs and bonds with the skin to continue killing germs even after washing. Please DO NOT use if you have an allergy to CHG or antibacterial soaps.  If your skin becomes reddened/irritated stop using the  CHG and inform your nurse when you arrive at Short Stay. Do not shave (including legs and underarms) for at least 48 hours prior to the first CHG shower.  You may shave your face/neck. Please follow these instructions carefully:  1.  Shower with CHG Soap the night before surgery and the  morning of Surgery.  2.  If you choose to wash your hair, wash your hair first as usual with your  normal  shampoo.  3.  After you shampoo, rinse your hair and body thoroughly to remove the  shampoo.                           4.  Use CHG as you would any other liquid soap.  You can apply chg directly  to the skin and wash                       Gently with  a scrungie or clean washcloth.  5.  Apply the CHG Soap to your body ONLY FROM THE NECK DOWN.   Do not use on face/ open                           Wound or open sores. Avoid contact with eyes, ears mouth and genitals (private parts).                       Wash face,  Genitals (private parts) with your normal soap.             6.  Wash thoroughly, paying special attention to the area where your surgery  will be performed.  7.  Thoroughly rinse your body with warm water from the neck down.  8.  DO NOT shower/wash with your normal soap after using and rinsing off  the CHG Soap.                9.  Pat yourself dry with a clean towel.            10.  Wear clean pajamas.            11.  Place clean sheets on your bed the night of your first shower and do not  sleep with pets. Day of Surgery : Do not apply any lotions/deodorants the morning of surgery.  Please wear clean clothes to the hospital/surgery center.  FAILURE TO FOLLOW THESE INSTRUCTIONS MAY RESULT IN THE CANCELLATION OF YOUR SURGERY PATIENT SIGNATURE_________________________________  NURSE SIGNATURE__________________________________  ________________________________________________________________________

## 2015-03-20 ENCOUNTER — Encounter (HOSPITAL_COMMUNITY)
Admission: RE | Admit: 2015-03-20 | Discharge: 2015-03-20 | Disposition: A | Discharge status: Home / Self Care | Payer: BLUE CROSS/BLUE SHIELD | Source: Ambulatory Visit | Attending: Orthopedic Surgery | Admitting: Orthopedic Surgery

## 2015-03-20 ENCOUNTER — Encounter (HOSPITAL_COMMUNITY): Payer: Self-pay

## 2015-03-20 DIAGNOSIS — S83289A Other tear of lateral meniscus, current injury, unspecified knee, initial encounter: Secondary | ICD-10-CM | POA: Diagnosis not present

## 2015-03-20 DIAGNOSIS — Z01818 Encounter for other preprocedural examination: Secondary | ICD-10-CM | POA: Diagnosis not present

## 2015-03-20 HISTORY — DX: Essential (primary) hypertension: I10

## 2015-03-20 HISTORY — DX: Personal history of other endocrine, nutritional and metabolic disease: Z86.39

## 2015-03-20 HISTORY — DX: Anxiety disorder, unspecified: F41.9

## 2015-03-20 HISTORY — DX: Gastro-esophageal reflux disease without esophagitis: K21.9

## 2015-03-20 LAB — BASIC METABOLIC PANEL
Anion gap: 7 (ref 5–15)
BUN: 12 mg/dL (ref 6–20)
CHLORIDE: 109 mmol/L (ref 101–111)
CO2: 28 mmol/L (ref 22–32)
CREATININE: 0.8 mg/dL (ref 0.44–1.00)
Calcium: 9.9 mg/dL (ref 8.9–10.3)
GFR calc Af Amer: >60 mL/min (ref >60)
GFR calc non Af Amer: >60 mL/min (ref >60)
GLUCOSE: 114 mg/dL — AB (ref 65–99)
POTASSIUM: 5.8 mmol/L — AB (ref 3.5–5.1)
Sodium: 144 mmol/L (ref 135–145)

## 2015-03-20 LAB — CBC
HEMATOCRIT: 44.8 % (ref 36.0–46.0)
Hemoglobin: 14.6 g/dL (ref 12.0–15.0)
MCH: 29.4 pg (ref 26.0–34.0)
MCHC: 32.6 g/dL (ref 30.0–36.0)
MCV: 90.1 fL (ref 78.0–100.0)
Platelets: 157 10*3/uL (ref 150–400)
RBC: 4.97 MIL/uL (ref 3.87–5.11)
RDW: 13.3 % (ref 11.5–15.5)
WBC: 6 10*3/uL (ref 4.0–10.5)

## 2015-03-25 DIAGNOSIS — S83289A Other tear of lateral meniscus, current injury, unspecified knee, initial encounter: Secondary | ICD-10-CM | POA: Diagnosis present

## 2015-03-25 NOTE — H&P (Signed)
  CC- VIANNY SCHRAEDER is a 59 y.o. female who presents with left knee pain.  HPI- . Knee Pain: Patient presents with knee pain involving the  left knee. Onset of the symptoms was several months ago. Inciting event: none known. Current symptoms include foreign body sensation, giving out, pain located laterally and swelling. Pain is aggravated by lateral movements, pivoting, rising after sitting, standing and walking.  Patient has had prior knee problems. Evaluation to date: MRI: abnormal lateral meniscal tear. Treatment to date: corticosteroid injection which was not very effective.  Past Medical History  Diagnosis Date  . Hypertension   . History of diabetes mellitus     none in 5 years since lap band surgery  . Anxiety   . GERD (gastroesophageal reflux disease)     Past Surgical History  Procedure Laterality Date  . Carpel tunnel Bilateral   . Laparoscopic gastric banding  12/02/2008  . Abdominal hysterectomy  2004  . Torn meniscus repair Left jan 2012    Prior to Admission medications   Medication Sig Start Date End Date Taking? Authorizing Provider  hydrOXYzine (ATARAX/VISTARIL) 10 MG tablet Take 10 mg by mouth at bedtime.   Yes Historical Provider, MD  pantoprazole (PROTONIX) 40 MG tablet Take 40 mg by mouth at bedtime.    Yes Historical Provider, MD  sertraline (ZOLOFT) 50 MG tablet Take 50 mg by mouth daily.     Yes Historical Provider, MD  traMADol (ULTRAM) 50 MG tablet Take 50 mg by mouth every 6 (six) hours as needed for moderate pain.   Yes Historical Provider, MD  zolpidem (AMBIEN) 10 MG tablet Take 10 mg by mouth at bedtime.  03/09/15  Yes Historical Provider, MD  lisinopril (PRINIVIL,ZESTRIL) 10 MG tablet Take 10 mg by mouth at bedtime.    Historical Provider, MD    antalgic gait, soft tissue tenderness over lateral joint line, effusion, negative drawer sign, collateral ligaments intact  Physical Examination: Physical Examination: General appearance - alert, well appearing,  and in no distress Mental status - alert, oriented to person, place, and time Chest - clear to auscultation, no wheezes, rales or rhonchi, symmetric air entry Heart - normal rate, regular rhythm, normal S1, S2, no murmurs, rubs, clicks or gallops Abdomen - soft, nontender, nondistended, no masses or organomegaly Neurological - alert, oriented, normal speech, no focal findings or movement disorder noted    Asessment/Plan--- Left knee lateral meniscal tear- - Plan left knee arthroscopy with meniscal debridement. Procedure risks and potential comps discussed with patient who elects to proceed. Goals are decreased pain and increased function with a high likelihood of achieving both

## 2015-03-26 ENCOUNTER — Ambulatory Visit (HOSPITAL_COMMUNITY): Payer: BLUE CROSS/BLUE SHIELD | Admitting: Anesthesiology

## 2015-03-26 ENCOUNTER — Ambulatory Visit (HOSPITAL_COMMUNITY)
Admission: RE | Admit: 2015-03-26 | Discharge: 2015-03-26 | Disposition: A | Discharge status: Home / Self Care | Payer: BLUE CROSS/BLUE SHIELD | Source: Ambulatory Visit | Attending: Orthopedic Surgery | Admitting: Orthopedic Surgery

## 2015-03-26 ENCOUNTER — Encounter (HOSPITAL_COMMUNITY): Payer: Self-pay | Admitting: *Deleted

## 2015-03-26 ENCOUNTER — Encounter (HOSPITAL_COMMUNITY)
Admission: RE | Disposition: A | Discharge status: Home / Self Care | Payer: Self-pay | Source: Ambulatory Visit | Attending: Orthopedic Surgery

## 2015-03-26 DIAGNOSIS — X58XXXA Exposure to other specified factors, initial encounter: Secondary | ICD-10-CM | POA: Insufficient documentation

## 2015-03-26 DIAGNOSIS — Z79899 Other long term (current) drug therapy: Secondary | ICD-10-CM | POA: Insufficient documentation

## 2015-03-26 DIAGNOSIS — M94262 Chondromalacia, left knee: Secondary | ICD-10-CM | POA: Diagnosis not present

## 2015-03-26 DIAGNOSIS — I10 Essential (primary) hypertension: Secondary | ICD-10-CM | POA: Insufficient documentation

## 2015-03-26 DIAGNOSIS — S83289A Other tear of lateral meniscus, current injury, unspecified knee, initial encounter: Secondary | ICD-10-CM | POA: Diagnosis present

## 2015-03-26 DIAGNOSIS — S83282A Other tear of lateral meniscus, current injury, left knee, initial encounter: Secondary | ICD-10-CM | POA: Diagnosis not present

## 2015-03-26 DIAGNOSIS — F17200 Nicotine dependence, unspecified, uncomplicated: Secondary | ICD-10-CM | POA: Diagnosis not present

## 2015-03-26 DIAGNOSIS — F419 Anxiety disorder, unspecified: Secondary | ICD-10-CM | POA: Insufficient documentation

## 2015-03-26 DIAGNOSIS — K219 Gastro-esophageal reflux disease without esophagitis: Secondary | ICD-10-CM | POA: Diagnosis not present

## 2015-03-26 DIAGNOSIS — Z8639 Personal history of other endocrine, nutritional and metabolic disease: Secondary | ICD-10-CM | POA: Diagnosis not present

## 2015-03-26 DIAGNOSIS — M25562 Pain in left knee: Secondary | ICD-10-CM | POA: Diagnosis present

## 2015-03-26 HISTORY — PX: KNEE ARTHROSCOPY: SHX127

## 2015-03-26 SURGERY — ARTHROSCOPY, KNEE
Anesthesia: General | Site: Knee | Laterality: Left

## 2015-03-26 MED ORDER — CEFAZOLIN SODIUM-DEXTROSE 2-3 GM-% IV SOLR
INTRAVENOUS | Status: AC
  Filled 2015-03-26: qty 50

## 2015-03-26 MED ORDER — METHOCARBAMOL 500 MG PO TABS: 500.0000 mg | ORAL_TABLET | Freq: Four times a day (QID) | ORAL | Status: AC

## 2015-03-26 MED ORDER — BUPIVACAINE-EPINEPHRINE (PF) 0.5% -1:200000 IJ SOLN
INTRAMUSCULAR | Status: DC
Start: 2015-03-26 — End: 2015-03-26
  Filled 2015-03-26: qty 30

## 2015-03-26 MED ORDER — PROMETHAZINE HCL 25 MG/ML IJ SOLN: 6.2500 mg | INTRAMUSCULAR | Status: DC | PRN

## 2015-03-26 MED ORDER — BUPIVACAINE-EPINEPHRINE (PF) 0.25% -1:200000 IJ SOLN
INTRAMUSCULAR | Status: AC
  Filled 2015-03-26: qty 30

## 2015-03-26 MED ORDER — SODIUM CHLORIDE 0.9 % IV SOLN
INTRAVENOUS | Status: DC
Start: 2015-03-26 — End: 2015-03-26

## 2015-03-26 MED ORDER — CEFAZOLIN SODIUM-DEXTROSE 2-3 GM-% IV SOLR
2.0000 g | INTRAVENOUS | Status: AC
  Administered 2015-03-26: 2 g via INTRAVENOUS

## 2015-03-26 MED ORDER — DEXAMETHASONE SODIUM PHOSPHATE 10 MG/ML IJ SOLN
INTRAMUSCULAR | Status: AC
  Filled 2015-03-26: qty 1

## 2015-03-26 MED ORDER — FENTANYL CITRATE (PF) 100 MCG/2ML IJ SOLN
INTRAMUSCULAR | Status: DC | PRN
  Administered 2015-03-26 (×2): 50 ug via INTRAVENOUS

## 2015-03-26 MED ORDER — BUPIVACAINE-EPINEPHRINE 0.25% -1:200000 IJ SOLN
INTRAMUSCULAR | Status: DC | PRN
  Administered 2015-03-26: 20 mL

## 2015-03-26 MED ORDER — HYDROCODONE-ACETAMINOPHEN 5-325 MG PO TABS: 1.0000 | ORAL_TABLET | Freq: Four times a day (QID) | ORAL | Status: AC | PRN

## 2015-03-26 MED ORDER — DEXAMETHASONE SODIUM PHOSPHATE 10 MG/ML IJ SOLN
10.0000 mg | Freq: Once | INTRAMUSCULAR | Status: AC
  Administered 2015-03-26: 10 mg via INTRAVENOUS

## 2015-03-26 MED ORDER — PROPOFOL 10 MG/ML IV BOLUS
INTRAVENOUS | Status: AC
  Filled 2015-03-26: qty 20

## 2015-03-26 MED ORDER — FENTANYL CITRATE (PF) 100 MCG/2ML IJ SOLN
INTRAMUSCULAR | Status: AC
  Filled 2015-03-26: qty 2

## 2015-03-26 MED ORDER — PROPOFOL 10 MG/ML IV BOLUS
INTRAVENOUS | Status: DC | PRN
  Administered 2015-03-26: 200 mg via INTRAVENOUS

## 2015-03-26 MED ORDER — MIDAZOLAM HCL 2 MG/2ML IJ SOLN
INTRAMUSCULAR | Status: AC
  Filled 2015-03-26: qty 4

## 2015-03-26 MED ORDER — MIDAZOLAM HCL 5 MG/5ML IJ SOLN
INTRAMUSCULAR | Status: DC | PRN
  Administered 2015-03-26: 2 mg via INTRAVENOUS

## 2015-03-26 MED ORDER — OXYCODONE HCL 5 MG/5ML PO SOLN
5.0000 mg | Freq: Once | ORAL | Status: AC | PRN
  Filled 2015-03-26: qty 5

## 2015-03-26 MED ORDER — ACETAMINOPHEN 10 MG/ML IV SOLN
1000.0000 mg | Freq: Once | INTRAVENOUS | Status: AC
  Administered 2015-03-26: 1000 mg via INTRAVENOUS

## 2015-03-26 MED ORDER — LIDOCAINE HCL (CARDIAC) 20 MG/ML IV SOLN
INTRAVENOUS | Status: DC | PRN
  Administered 2015-03-26: 100 mg via INTRAVENOUS

## 2015-03-26 MED ORDER — ONDANSETRON HCL 4 MG/2ML IJ SOLN
INTRAMUSCULAR | Status: DC | PRN
  Administered 2015-03-26: 4 mg via INTRAVENOUS

## 2015-03-26 MED ORDER — FENTANYL CITRATE (PF) 100 MCG/2ML IJ SOLN
25.0000 ug | INTRAMUSCULAR | Status: DC | PRN
  Administered 2015-03-26 (×2): 50 ug via INTRAVENOUS

## 2015-03-26 MED ORDER — OXYCODONE HCL 5 MG PO TABS
5.0000 mg | ORAL_TABLET | Freq: Once | ORAL | Status: AC | PRN
  Administered 2015-03-26: 5 mg via ORAL
  Filled 2015-03-26: qty 1

## 2015-03-26 MED ORDER — LIDOCAINE HCL (CARDIAC) 20 MG/ML IV SOLN
INTRAVENOUS | Status: AC
  Filled 2015-03-26: qty 5

## 2015-03-26 MED ORDER — ACETAMINOPHEN 10 MG/ML IV SOLN
INTRAVENOUS | Status: AC
  Filled 2015-03-26: qty 100

## 2015-03-26 MED ORDER — LACTATED RINGERS IV SOLN
INTRAVENOUS | Status: DC
  Administered 2015-03-26: 1000 mL via INTRAVENOUS

## 2015-03-26 MED ORDER — ONDANSETRON HCL 4 MG/2ML IJ SOLN
INTRAMUSCULAR | Status: AC
  Filled 2015-03-26: qty 2

## 2015-03-26 MED ORDER — LACTATED RINGERS IR SOLN
Status: DC | PRN
  Administered 2015-03-26: 6000 mL

## 2015-03-26 SURGICAL SUPPLY — 27 items
BANDAGE ELASTIC 6 VELCRO ST LF (GAUZE/BANDAGES/DRESSINGS) ×2 IMPLANT
BLADE 4.2CUDA (BLADE) ×2 IMPLANT
COVER SURGICAL LIGHT HANDLE (MISCELLANEOUS) ×2 IMPLANT
CUFF TOURN SGL QUICK 34 (TOURNIQUET CUFF) ×1
CUFF TRNQT CYL 34X4X40X1 (TOURNIQUET CUFF) ×1 IMPLANT
DRAPE U-SHAPE 47X51 STRL (DRAPES) ×2 IMPLANT
DRSG EMULSION OIL 3X3 NADH (GAUZE/BANDAGES/DRESSINGS) ×2 IMPLANT
DRSG PAD ABDOMINAL 8X10 ST (GAUZE/BANDAGES/DRESSINGS) ×2 IMPLANT
DURAPREP 26ML APPLICATOR (WOUND CARE) ×2 IMPLANT
GAUZE SPONGE 4X4 12PLY STRL (GAUZE/BANDAGES/DRESSINGS) ×2 IMPLANT
GLOVE BIO SURGEON STRL SZ8 (GLOVE) ×2 IMPLANT
GLOVE BIOGEL PI IND STRL 8 (GLOVE) ×3 IMPLANT
GLOVE BIOGEL PI INDICATOR 8 (GLOVE) ×3
GOWN STRL REUS W/TWL LRG LVL3 (GOWN DISPOSABLE) ×4 IMPLANT
KIT BASIN OR (CUSTOM PROCEDURE TRAY) ×2 IMPLANT
MANIFOLD NEPTUNE II (INSTRUMENTS) ×2 IMPLANT
PACK ARTHROSCOPY WL (CUSTOM PROCEDURE TRAY) ×2 IMPLANT
PACK ICE MAXI GEL EZY WRAP (MISCELLANEOUS) ×6 IMPLANT
PADDING CAST COTTON 6X4 STRL (CAST SUPPLIES) ×4 IMPLANT
PEN SKIN MARKING BROAD (MISCELLANEOUS) ×2 IMPLANT
POSITIONER SURGICAL ARM (MISCELLANEOUS) ×2 IMPLANT
SET ARTHROSCOPY TUBING (MISCELLANEOUS) ×1
SET ARTHROSCOPY TUBING LN (MISCELLANEOUS) ×1 IMPLANT
SUT ETHILON 4 0 PS 2 18 (SUTURE) ×2 IMPLANT
TOWEL OR 17X26 10 PK STRL BLUE (TOWEL DISPOSABLE) ×2 IMPLANT
WAND 90 DEG TURBOVAC W/CORD (SURGICAL WAND) ×2 IMPLANT
WRAP KNEE MAXI GEL POST OP (GAUZE/BANDAGES/DRESSINGS) ×2 IMPLANT

## 2015-03-26 NOTE — Anesthesia Procedure Notes (Signed)
Procedure Name: LMA Insertion Date/Time: 03/26/2015 1:23 PM Performed by: Orest Dikes Pre-anesthesia Checklist: Patient identified, Emergency Drugs available, Suction available and Patient being monitored Patient Re-evaluated:Patient Re-evaluated prior to inductionOxygen Delivery Method: Circle system utilized Preoxygenation: Pre-oxygenation with 100% oxygen Intubation Type: IV induction LMA: LMA inserted LMA Size: 4.0 Number of attempts: 1 Placement Confirmation: positive ETCO2 and breath sounds checked- equal and bilateral Tube secured with: Tape Dental Injury: Teeth and Oropharynx as per pre-operative assessment

## 2015-03-26 NOTE — Transfer of Care (Signed)
Immediate Anesthesia Transfer of Care Note  Patient: Candice Holland  Procedure(s) Performed: Procedure(s): LEFT KNEE ARTHROSCOPY DEBRIDEMENT AND CHONDROPLASTY (Left)  Patient Location: PACU  Anesthesia Type:General  Level of Consciousness:  sedated, patient cooperative and responds to stimulation  Airway & Oxygen Therapy:Patient Spontanous Breathing and Patient connected to face mask oxgen  Post-op Assessment:  Report given to PACU RN and Post -op Vital signs reviewed and stable  Post vital signs:  Reviewed and stable  Last Vitals:  Filed Vitals:   03/26/15 1012  BP: 139/58  Pulse: 71  Temp: 36.7 C  Resp: 18    Complications: No apparent anesthesia complications

## 2015-03-26 NOTE — Op Note (Signed)
Preoperative diagnosis-  Left knee lateral meniscal tear  Postoperative diagnosis Left- knee lateral meniscal tear plus  Chondral defect trochlea  Procedure- Left knee arthroscopy with lateral  meniscal debridement and chondroplasty   Surgeon- Gus Rankin. Aluisio, MD  Anesthesia-General  EBL-  Minimal  Complications- None  Condition- PACU - hemodynamically stable.  Brief clinical note- -Candice Holland is a 59 y.o.  female with a several month history of left knee pain and mechanical symptoms. Exam and history suggested lateral meniscal tear confirmed by MRI. The patient presents now for arthroscopy and debridement  Procedure in detail -       After successful administration of General anesthetic, a tourmiquet is placed high on the Left  thigh and the Left lower extremity is prepped and draped in the usual sterile fashion. Time out is performed by the surgical team. Standard superomedial and inferolateral portal sites are marked and incisions made with an 11 blade. The inflow cannula is passed through the superomedial portal and camera through the inferolateral portal and inflow is initiated. Arthroscopic visualization proceeds.      The undersurface of the patella and trochlea are visualized and there are grade III and IV scattered changes in the central and medial trochlea as well as Grade II and III changes in the patella. The medial and lateral gutters are visualized and there are  no loose bodies. Flexion and valgus force is applied to the knee and the medial compartment is entered. A spinal needle is passed into the joint through the site marked for the inferomedial portal. A small incision is made and the dilator passed into the joint. The findings for the medial compartment are normal with mild chondromalacia medial femoral condyle .     The intercondylar notch is visualized and the ACL appears normal. The lateral compartment is entered and the findings are unstable tear body and  posterior horn lateral meniscus with Grade III changes lateral compartment . The tear is debrided to a stable base with baskets and a shaver and sealed off with the Arthrocare. The shaver is used to debride the unstable cartilage to a stable cartilaginous base with stable edges. The unstable cartilage in the trochlea is debrided to a stable bony base and the bone also abraded with the shaver..     The joint is again inspected and there are no other tears, defects or loose bodies identified. The arthroscopic equipment is then removed from the inferior portals which are closed with interrupted 4-0 nylon. 20 ml of .25% Marcaine with epinephrine are injected through the inflow cannula and the cannula is then removed and the portal closed with nylon. The incisions are cleaned and dried and a bulky sterile dressing is applied. The patient is then awakened and transported to recovery in stable condition.   03/26/2015, 1:59 PM

## 2015-03-26 NOTE — Discharge Instructions (Signed)
° °Dr. Azaryah Oleksy °Total Joint Specialist °[facility] °[address] °[place], [place] [address] °[phone] ° ° °Arthroscopic Procedure, Knee °An arthroscopic procedure can find what is wrong with your knee. °PROCEDURE °Arthroscopy is a surgical technique that allows your orthopedic surgeon to diagnose and treat your knee injury with accuracy. They will look into your knee through a small instrument. This is almost like a small (pencil sized) telescope. Because arthroscopy affects your knee less than open knee surgery, you can anticipate a more rapid recovery. Taking an active role by following your caregiver's instructions will help with rapid and complete recovery. Use crutches, rest, elevation, ice, and knee exercises as instructed. The length of recovery depends on various factors including type of injury, age, physical condition, medical conditions, and your rehabilitation. °Your knee is the joint between the large bones (femur and tibia) in your leg. Cartilage covers these bone ends which are smooth and slippery and allow your knee to bend and move smoothly. Two menisci, thick, semi-lunar shaped pads of cartilage which form a rim inside the joint, help absorb shock and stabilize your knee. Ligaments bind the bones together and support your knee joint. Muscles move the joint, help support your knee, and take stress off the joint itself. Because of this all programs and physical therapy to rehabilitate an injured or repaired knee require rebuilding and strengthening your muscles. °AFTER THE PROCEDURE °· After the procedure, you will be moved to a recovery area until most of the effects of the medication have worn off. Your caregiver will discuss the test results with you.  °· Only take over-the-counter or prescription medicines for pain, discomfort, or fever as directed by your caregiver.  °SEEK MEDICAL CARE IF:  °· You have increased bleeding from your wounds.  °· You see  redness, swelling, or have increasing pain in your wounds.  °· You have pus coming from your wound.  °· You have an oral temperature above 102° F (38.9° C).  °· You notice a bad smell coming from the wound or dressing.  °· You have severe pain with any motion of your knee.  °SEEK IMMEDIATE MEDICAL CARE IF:  °· You develop a rash.  °· You have difficulty breathing.  °· You have any allergic problems.  °FURTHER INSTRUCTIONS:  °· ICE to the affected knee every three hours for 30 minutes at a time and then as needed for pain and swelling.  Continue to use ice on the knee for pain and swelling from surgery. You may notice swelling that will progress down to the foot and ankle.  This is normal after surgery.  Elevate the leg when you are not up walking on it.   ° °DIET °You may resume your previous home diet once your are discharged from the hospital. ° °DRESSING / WOUND CARE / SHOWERING °You may start showering two days after being discharged home but do not submerge the incisions under water.  °Change dressing 48 hours after the procedure and then cover the small incisions with band aids until your follow up visit. °Change the surgical dressings daily and reapply a dry dressing each time.  ° °ACTIVITY °Walk with your walker as instructed. °Use walker as long as suggested by your caregivers. °Avoid periods of inactivity such as sitting longer than an hour when not asleep. This helps prevent blood clots.  °You may resume a sexual relationship in one month or when given the OK by your doctor.  °You may return to   work once you are cleared by your doctor.  °Do not drive a car for 6 weeks or until released by you surgeon.  °Do not drive while taking narcotics. ° °WEIGHT BEARING AS TOLERATED ° °POSTOPERATIVE CONSTIPATION PROTOCOL °Constipation - defined medically as fewer than three stools per week and severe constipation as less than one stool per week. ° °One of the most common issues patients have following surgery is  constipation.  Even if you have a regular bowel pattern at home, your normal regimen is likely to be disrupted due to multiple reasons following surgery.  Combination of anesthesia, postoperative narcotics, change in appetite and fluid intake all can affect your bowels.  In order to avoid complications following surgery, here are some recommendations in order to help you during your recovery period. ° °Colace (docusate) - Pick up an over-the-counter form of Colace or another stool softener and take twice a day as long as you are requiring postoperative pain medications.  Take with a full glass of water daily.  If you experience loose stools or diarrhea, hold the colace until you stool forms back up.  If your symptoms do not get better within 1 week or if they get worse, check with your doctor. ° °Dulcolax (bisacodyl) - Pick up over-the-counter and take as directed by the product packaging as needed to assist with the movement of your bowels.  Take with a full glass of water.  Use this product as needed if not relieved by Colace only.  ° °MiraLax (polyethylene glycol) - Pick up over-the-counter to have on hand.  MiraLax is a solution that will increase the amount of water in your bowels to assist with bowel movements.  Take as directed and can mix with a glass of water, juice, soda, coffee, or tea.  Take if you go more than two days without a movement. °Do not use MiraLax more than once per day. Call your doctor if you are still constipated or irregular after using this medication for 7 days in a row. ° °If you continue to have problems with postoperative constipation, please contact the office for further assistance and recommendations.  If you experience "the worst abdominal pain ever" or develop nausea or vomiting, please contact the office immediatly for further recommendations for treatment. ° °ITCHING ° If you experience itching with your medications, try taking only a single pain pill, or even half a pain pill  at a time.  You can also use Benadryl over the counter for itching or also to help with sleep.  ° °TED HOSE STOCKINGS °Wear the elastic stockings on both legs for three weeks following surgery during the day but you may remove then at night for sleeping. ° °MEDICATIONS °See your medication summary on the “After Visit Summary” that the nursing staff will review with you prior to discharge.  You may have some home medications which will be placed on hold until you complete the course of blood thinner medication.  It is important for you to complete the blood thinner medication as prescribed by your surgeon.  Continue your approved medications as instructed at time of discharge. °Do not drive while taking narcotics.  ° °PRECAUTIONS °If you experience chest pain or shortness of breath - call 911 immediately for transfer to the hospital emergency department.  °If you develop a fever greater that 101 F, purulent drainage from wound, increased redness or drainage from wound, foul odor from the wound/dressing, or calf pain - CONTACT YOUR SURGEON.   °                                                °  FOLLOW-UP APPOINTMENTS °Make sure you keep all of your appointments after your operation with your surgeon and caregivers. You should call the office at [phone]  and make an appointment for approximately one week after the date of your surgery or on the date instructed by your surgeon outlined in the "After Visit Summary". ° °RANGE OF MOTION AND STRENGTHENING EXERCISES  °Rehabilitation of the knee is important following a knee injury or an operation. After just a few days of immobilization, the muscles of the thigh which control the knee become weakened and shrink (atrophy). Knee exercises are designed to build up the tone and strength of the thigh muscles and to improve knee motion. Often times heat used for twenty to thirty minutes before working out will loosen up your tissues and help with improving the range of motion  but do not use heat for the first two weeks following surgery. These exercises can be done on a training (exercise) mat, on the floor, on a table or on a bed. Use what ever works the best and is most comfortable for you Knee exercises include: ° °QUAD STRENGTHENING EXERCISES °Strengthening Quadriceps Sets ° °Tighten muscles on top of thigh by pushing knees down into floor or table. °Hold for 20 seconds. Repeat 10 times. °Do 2 sessions per day. ° ° ° ° °Strengthening Terminal Knee Extension ° °With knee bent over bolster, straighten knee by tightening muscle on top of thigh. Be sure to keep bottom of knee on bolster. °Hold for 20 seconds. Repeat 10 times. °Do 2 sessions per day. ° ° °Straight Leg with Bent Knee ° °Lie on back with opposite leg bent. Keep involved knee slightly bent at knee and raise leg 4-6". Hold for 10 seconds. °Repeat 20 times per set. °Do 2 sets per session. °Do 2 sessions per day. ° °

## 2015-03-26 NOTE — Anesthesia Postprocedure Evaluation (Signed)
  Anesthesia Post-op Note  Patient: Candice Holland  Procedure(s) Performed: Procedure(s) (LRB): LEFT KNEE ARTHROSCOPY DEBRIDEMENT AND CHONDROPLASTY (Left)  Patient Location: PACU  Anesthesia Type: General  Level of Consciousness: awake and alert   Airway and Oxygen Therapy: Patient Spontanous Breathing  Post-op Pain: mild  Post-op Assessment: Post-op Vital signs reviewed, Patient's Cardiovascular Status Stable, Respiratory Function Stable, Patent Airway and No signs of Nausea or vomiting  Last Vitals:  Filed Vitals:   03/26/15 1545  BP: 132/57  Pulse: 63  Temp:   Resp: 16    Post-op Vital Signs: stable   Complications: No apparent anesthesia complications

## 2015-03-26 NOTE — Anesthesia Preprocedure Evaluation (Addendum)
Anesthesia Evaluation  Patient identified by MRN, date of birth, ID band Patient awake    Reviewed: Allergy & Precautions, H&P , NPO status , Patient's Chart, lab work & pertinent test results  History of Anesthesia Complications Negative for: history of anesthetic complications  Airway Mallampati: II  TM Distance: >3 FB Neck ROM: full    Dental  (+) Edentulous Lower, Edentulous Upper   Pulmonary neg pulmonary ROS, Current Smoker,     + decreased breath sounds      Cardiovascular hypertension, Pt. on medications Normal cardiovascular exam Rhythm:regular Rate:Normal     Neuro/Psych Depression negative neurological ROS     GI/Hepatic Neg liver ROS, GERD  Medicated,  Endo/Other  History of lap gastric band procedure  Renal/GU negative Renal ROS     Musculoskeletal   Abdominal   Peds  Hematology negative hematology ROS (+)   Anesthesia Other Findings + smoking,   Reproductive/Obstetrics negative OB ROS                            Anesthesia Physical Anesthesia Plan  ASA: II  Anesthesia Plan: General   Post-op Pain Management:    Induction: Intravenous  Airway Management Planned: LMA  Additional Equipment:   Intra-op Plan:   Post-operative Plan: Extubation in OR  Informed Consent: I have reviewed the patients History and Physical, chart, labs and discussed the procedure including the risks, benefits and alternatives for the proposed anesthesia with the patient or authorized representative who has indicated his/her understanding and acceptance.   Dental Advisory Given  Plan Discussed with: Anesthesiologist, CRNA and Surgeon  Anesthesia Plan Comments:         Anesthesia Quick Evaluation

## 2015-03-26 NOTE — Interval H&P Note (Signed)
History and Physical Interval Note:  03/26/2015 1:16 PM  Candice Holland  has presented today for surgery, with the diagnosis of LEFT KNEE LATERAL MENSICAL TEAR    The various methods of treatment have been discussed with the patient and family. After consideration of risks, benefits and other options for treatment, the patient has consented to  Procedure(s): LEFT KNEE ARTHROSCOPY DEBRIDEMENT  (Left) as a surgical intervention .  The patient's history has been reviewed, patient examined, no change in status, stable for surgery.  I have reviewed the patient's chart and labs.  Questions were answered to the patient's satisfaction.     Loanne Drilling

## 2016-01-26 ENCOUNTER — Other Ambulatory Visit: Payer: Self-pay | Admitting: Family Medicine

## 2016-01-26 DIAGNOSIS — R590 Localized enlarged lymph nodes: Secondary | ICD-10-CM

## 2016-01-29 ENCOUNTER — Other Ambulatory Visit: Payer: BLUE CROSS/BLUE SHIELD

## 2016-01-30 ENCOUNTER — Ambulatory Visit
Admission: RE | Admit: 2016-01-30 | Discharge: 2016-01-30 | Disposition: A | Discharge status: Home / Self Care | Payer: Managed Care, Other (non HMO) | Source: Ambulatory Visit | Attending: Family Medicine | Admitting: Family Medicine

## 2016-01-30 DIAGNOSIS — R590 Localized enlarged lymph nodes: Secondary | ICD-10-CM

## 2018-04-11 IMAGING — US US SOFT TISSUE HEAD/NECK
1 series · 14 of 25 positions shown · non-contrast
Comparison: None.

CLINICAL DATA: 60-year-old female with left submandibular palpable
mass ever since having the flu this past [REDACTED]. The palpable nodule
is tender and has decreased in size over time.

EXAM:
ULTRASOUND OF HEAD/NECK SOFT TISSUES
TECHNIQUE: Ultrasound examination of the head and neck soft tissues was
performed in the area of clinical concern.

[Series 1: us soft tissue head/neck · 0.07mm/px · 28 acquisitions, 14 frames shown]
[im 1/28]
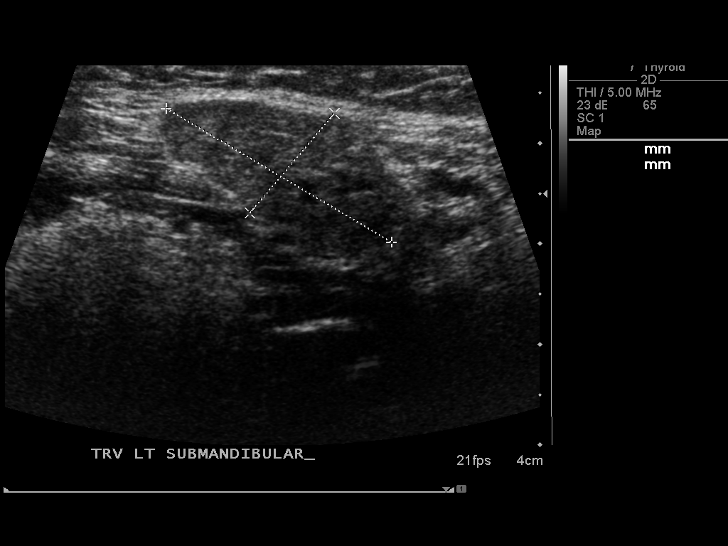
[im 3/28]
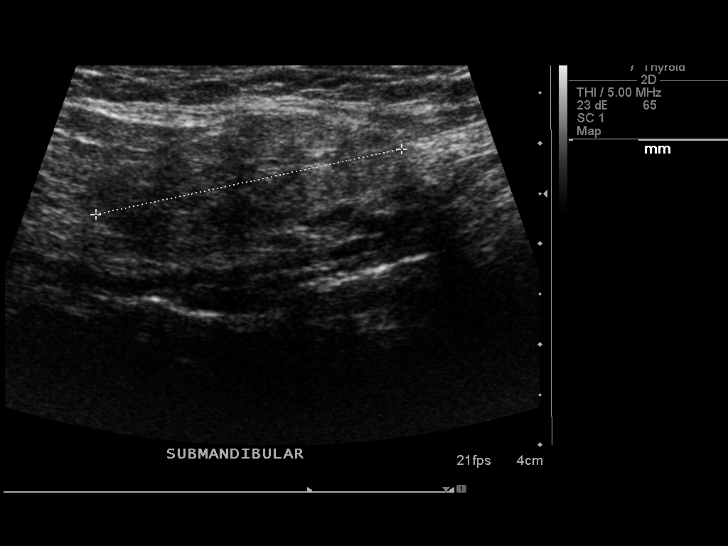
[im 5/28]
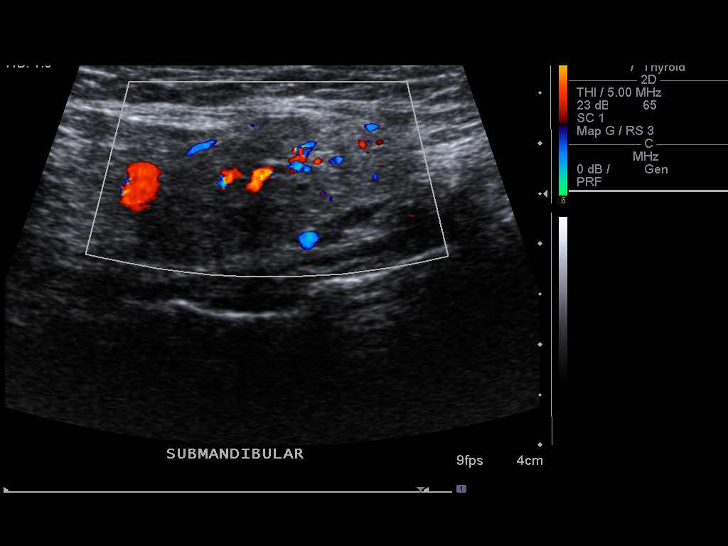
[im 7/28]
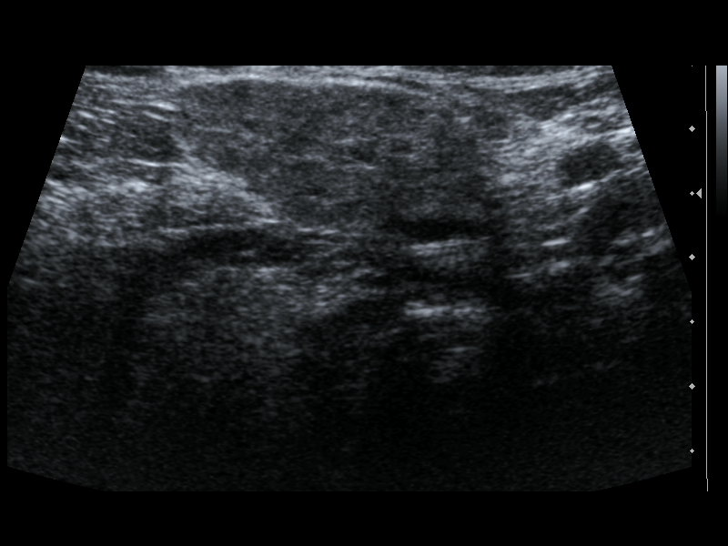
[im 10/28]
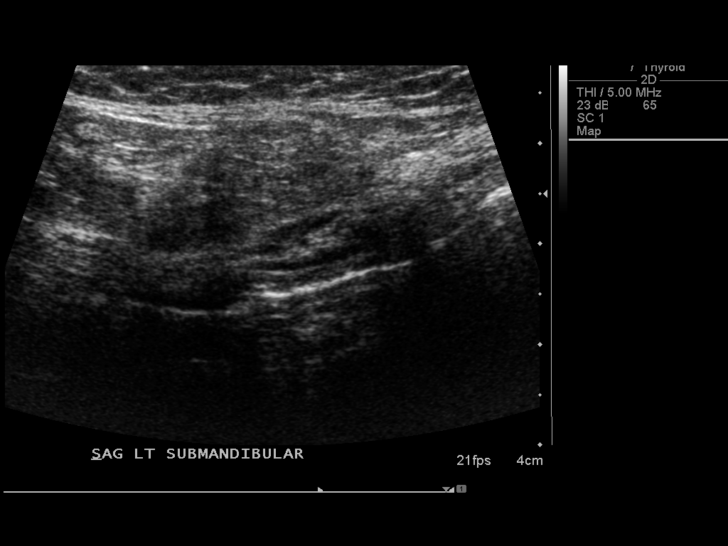
[im 11/28]
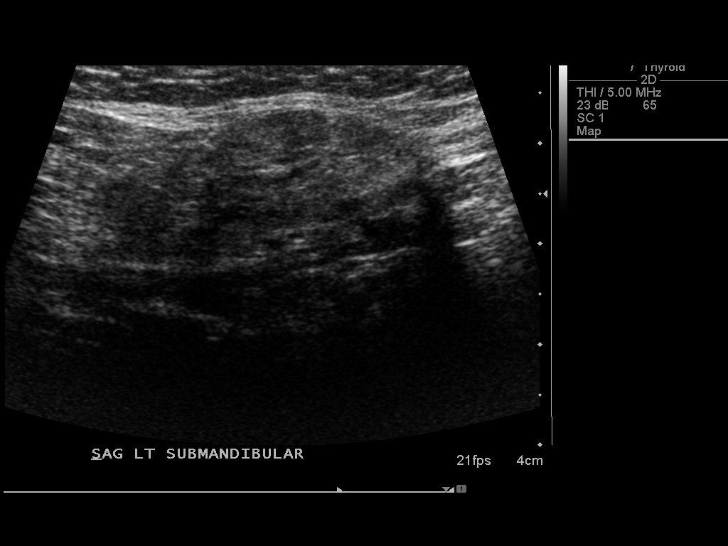
[im 13/28]
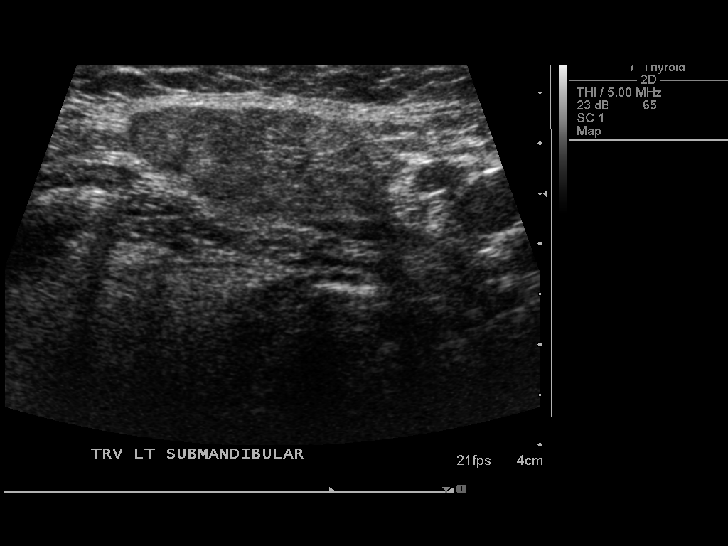
[im 15/28]
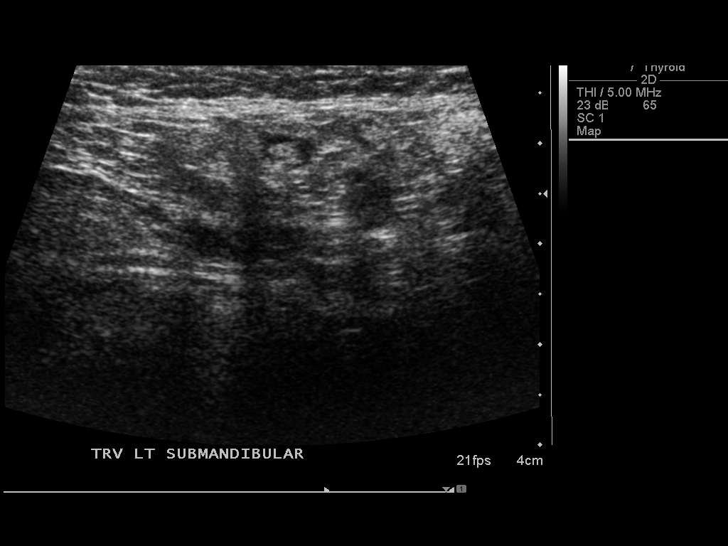
[im 17/28]
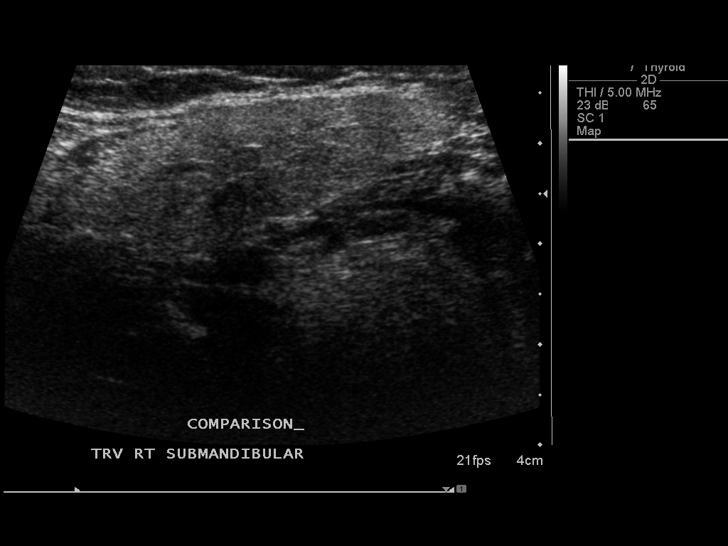
[im 19/28]
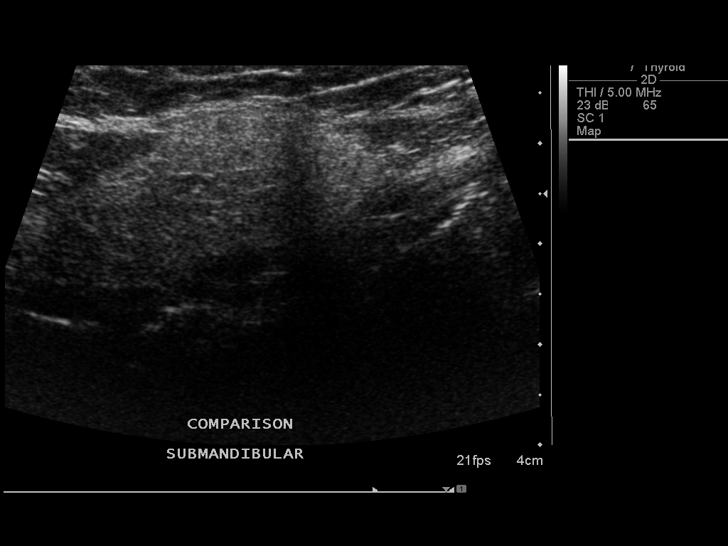
[im 21/28]
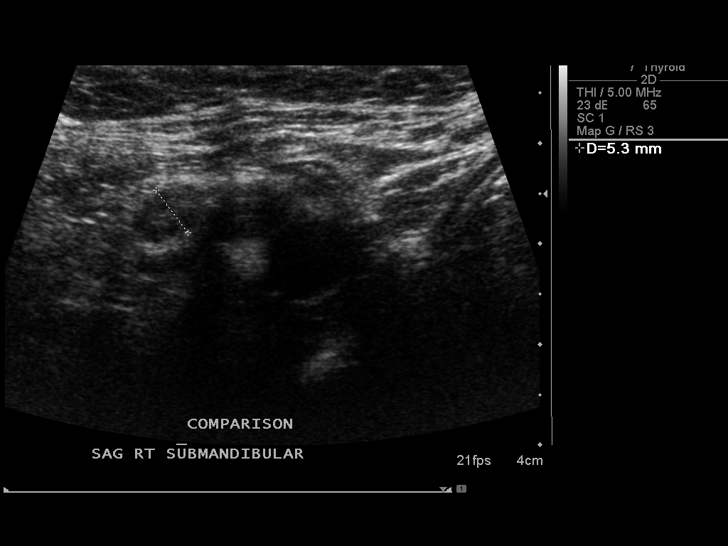
[im 23/28]
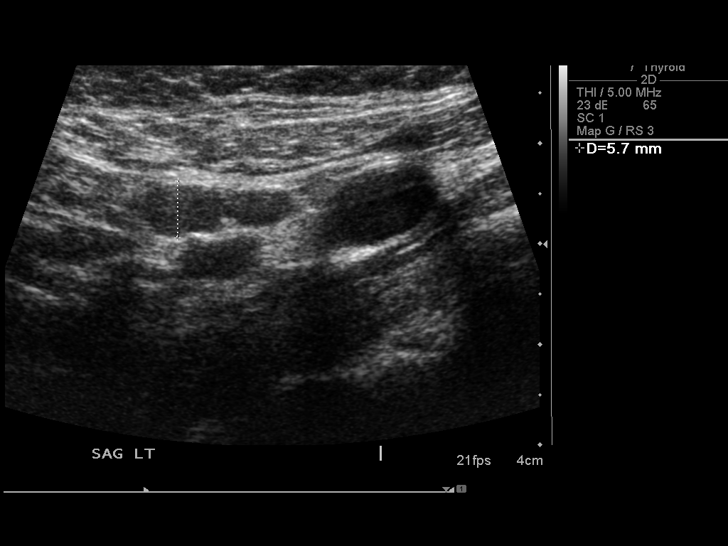
[im 25/28]
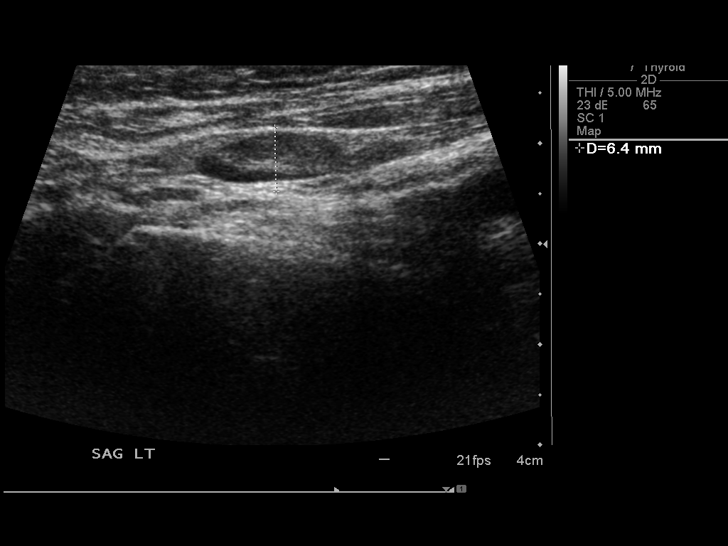
[im 28/28]
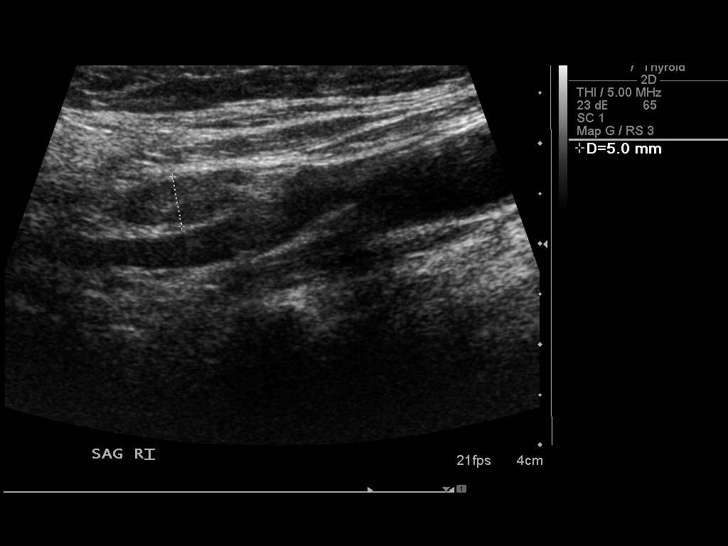

[14 of 25 positions shown; findings below may reference images not displayed]

FINDINGS: Sonographic interrogation of the region of clinical concern in the
left submandibular region demonstrates a prominent left
submandibular gland which measures 2.6 x 1.3 x 3.1 cm. The gland is
mildly heterogeneous. There are a few very small sub cm
submandibular lymph nodes which are normal in size and morphology.
Imaging of the contralateral submandibular gland demonstrates
similar findings.
IMPRESSION: The palpable abnormality corresponds with the left submandibular
gland which is mildly heterogeneous but otherwise unremarkable.

No suspicious or abnormal lymphadenopathy.

## 2018-06-26 ENCOUNTER — Encounter (HOSPITAL_COMMUNITY): Payer: Self-pay

## 2019-10-23 ENCOUNTER — Other Ambulatory Visit: Payer: Self-pay | Admitting: Family Medicine

## 2019-10-23 DIAGNOSIS — Z1231 Encounter for screening mammogram for malignant neoplasm of breast: Secondary | ICD-10-CM

## 2019-10-30 ENCOUNTER — Ambulatory Visit: Payer: Managed Care, Other (non HMO)

## 2019-11-05 ENCOUNTER — Ambulatory Visit: Payer: Managed Care, Other (non HMO)

## 2019-11-13 ENCOUNTER — Ambulatory Visit: Payer: Managed Care, Other (non HMO)

## 2019-12-11 ENCOUNTER — Ambulatory Visit: Payer: Managed Care, Other (non HMO)

## 2020-01-16 ENCOUNTER — Ambulatory Visit: Payer: Managed Care, Other (non HMO)

## 2021-11-04 ENCOUNTER — Other Ambulatory Visit: Payer: Self-pay | Admitting: Family Medicine

## 2021-11-04 DIAGNOSIS — Z1231 Encounter for screening mammogram for malignant neoplasm of breast: Secondary | ICD-10-CM

## 2021-12-08 ENCOUNTER — Ambulatory Visit: Payer: Managed Care, Other (non HMO)

## 2022-01-26 ENCOUNTER — Ambulatory Visit: Payer: Managed Care, Other (non HMO)

## 2022-02-16 ENCOUNTER — Ambulatory Visit: Payer: Managed Care, Other (non HMO)

## 2022-03-16 ENCOUNTER — Ambulatory Visit: Payer: Managed Care, Other (non HMO)

## 2022-06-01 ENCOUNTER — Ambulatory Visit: Payer: Managed Care, Other (non HMO)

## 2023-12-05 NOTE — Patient Instructions (Addendum)
 Patient Instructions A1c 14.7%  INCREASE Tresiba to 26 units every night  INCREASE your Novolog to 8 units with breakfast, lunch, and dinner PLUS your sliding scale  70-150 = NO additional units  151-200 = ADD 1 unit  201-250 = ADD 2 units  251-300 = ADD 3 units  301-350 = ADD 4 units  351-400 = ADD 5 units  401-450 = ADD 6 units  451> = ADD 7 units and recheck a blood sugar in 1 hour. IF IT IS STILL GREATER THAN 300, call office for further instructions  Continue to use your Herlene to monitor your glucose levels  Return to clinic in 3 months

## 2023-12-05 NOTE — Progress Notes (Signed)
 Subjective   Patient ID: Candice Holland is a 68 y.o. (DOB 1956-06-17) female  History of Present Illness: This is a 68 y.o. 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   As noted in the initial consult on 04/26/23, she was initially diagnosed with T2D in 2022. During the month of August 2024, she reported unintentional weight loss of 90 pounds, warranting the screening of T1D at our visit.   Lab Results  Component Value Date/Time   C-Peptide, Serum 3.7 04/26/2023 03:33 PM   Islet Cell Ab Negative 04/26/2023 03:33 PM   GAD-65 Comment (A) 04/26/2023 03:33 PM   ZNT8 ANTIBODIES >500 (H) 04/26/2023 03:33 PM   Insulin Antibodies <5.0 04/26/2023 03:33 PM   IA-2 AUTOANTIBODIES >120 (H) 04/26/2023 03:33 PM   Glucose 469 (H) 04/26/2023 03:33 PM   Medical history significant for lap band placement on June 2010 by Bariatric surgery, osteoarthritis of the L knee, HTN, GERD.   Of note, she has tried Ozempic and Metformin. Intolerant to both.    Patient reports quitting smoking on May 15th.   A1c 14.7% on 12/05/23. Last A1c 12.7% on 03/29/23, 17.4% on 02/25/23, 17.8% on 02/21/23, 5.9% on 10/14/22, 6.2% on 03/02/22, 7.6% on 11/02/21, 7.0% on 06/04/21, 6.1% on 10/29/20, 5.9% on 10/23/22.   Patient confirms taking diabetes medications as follows:  Tresiba 20 units qHS Reports only doing 15 units  Novolog 4 units TID AC    Weight trend:  Body mass index is 27.21 kg/m.  Wt Readings from Last 3 Encounters:  12/05/23 144 lb (65.3 kg)  06/02/23 125 lb (56.7 kg)  04/26/23 118 lb (53.5 kg)    Retinopathy: No historical documentation  Neuropathy: No historical documentation  Nephropathy: No historical documentation    Cardiovascular disease: No historical documentation  Cerebrovascular disease: No historical documentation  Peripheral vascular disease: No historical documentation    Most recent retinal exam:  Unknown  Most recent LDL cholesterol:  43mg /dL  (90/96/75)               Taking statin?:   Rosuvastatin 5mg   Most recent urine microalbumin/Cr ratio: 30mg /g (03/01/23)                Taking ACEI/ARB?:  Lisinopril 10mg   Taking daily ASA?:   No  Current smoker?:   Yes;<1PPD Discussed smoking cessation?:  Yes    Reviewed and updated this visit by provider: Tobacco  Allergies  Meds  Problems  Med Hx  Surg Hx  Fam Hx      ROS Eight systems were reviewed, and pertinent positives and negatives are as discussed in HPI.  Objective   Vitals:   12/05/23 1239  BP: 126/74  Weight: 144 lb (65.3 kg)    Physical Exam Vitals reviewed.  Cardiovascular:     Rate and Rhythm: Normal rate and regular rhythm.  Pulmonary:     Effort: Pulmonary effort is normal.     Breath sounds: Normal breath sounds.  Skin:    General: Skin is warm and dry.  Neurological:     General: No focal deficit present.     Mental Status: She is alert and oriented to person, place, and time. Mental status is at baseline.  Psychiatric:        Mood and Affect: Mood normal.        Behavior: Behavior normal.        Thought Content: Thought content normal.        Judgment:  Judgment normal.        Laboratory data  Lab Results  Component Value Date   Hemoglobin A1c 14.7 (A) 12/05/2023   Hemoglobin A1c EXTERNAL 17.4 (H) 02/25/2023   Hemoglobin A1c (POC) EXTERNAL 12.7 (A) 03/29/2023   No results found for this or any previous visit. Lab Results  Component Value Date/Time   Glucose 469 (H) 04/26/2023 03:33 PM   Albumin, Urine - External <7 03/01/2023 10:53 AM    No results found for: CHOL, TRIG, HDL, VLDL, LDL  No results found for: TSH   Assessment and Plan   1. 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 Uncontrolled type 1 diabetes mellitus with hyperglycemia (*) -     POCT Hemoglobin  A1C -     Ambulatory referral to Nutrition Services -     Ambulatory referral to Diabetes Education  Discussion & Recommendations A1c remains extremely high. I again went over her LADA diagnosis, disease process, glucose management, medication adherence, and importance of consistency in regimen. Patient is open to a [facility] and clinical nutritionist referral. Patient instructed to adjust her Tresiba and Novolog. Sliding scale added to Novolog. Directions printed out for the patient.   Patient Instructions A1c 14.7%  INCREASE Tresiba to 26 units every night  INCREASE your Novolog to 8 units with breakfast, lunch, and dinner PLUS your sliding scale  70-150 = NO additional units  151-200 = ADD 1 unit  201-250 = ADD 2 units  251-300 = ADD 3 units  301-350 = ADD 4 units  351-400 = ADD 5 units  401-450 = ADD 6 units  451> = ADD 7 units and recheck a blood sugar in 1 hour. IF IT IS STILL GREATER THAN 300, call office for further instructions  Continue to use your Herlene to monitor your glucose levels  Return to clinic in 3 months      Follow up in about 3 months (around 03/06/2024) for T1D (CGM); 40 min.   Documentation for time-based billing:  Total time spent of date of service was 35 minutes.  Patient care activities included preparing to see the patient such as reviewing the patient record, obtaining and/or reviewing separately obtained history, performing a medically appropriate history and physical examination, counseling and educating the patient, family, and/or caregiver, ordering prescription medications, tests, or procedures, referring and communicating with other health care providers when not separately reported during the visit, documenting clinical information in the electronic or other health record, independently interpreting results when not separately reported, communicating results to the patient/family/caregiver, and coordinating the care of the patient when not separately  reported.     [name], NP 12/05/2023, 3:15 PM
